# Patient Record
Sex: Male | Born: 1985 | Race: White | Hispanic: No | Marital: Single | Smoking: Current every day smoker
Health system: Southern US, Community
[De-identification: ages and names within clinical notes are randomized; demographics above are authoritative.]

## PROBLEM LIST (undated history)

## (undated) DIAGNOSIS — Z720 Tobacco use: Secondary | ICD-10-CM

---

## 2013-09-10 ENCOUNTER — Emergency Department (HOSPITAL_COMMUNITY)
Admission: EM | Admit: 2013-09-10 | Discharge: 2013-09-10 | Disposition: A | Discharge status: Home / Self Care | Payer: PRIVATE HEALTH INSURANCE | Attending: Emergency Medicine | Admitting: Emergency Medicine

## 2013-09-10 ENCOUNTER — Encounter (HOSPITAL_COMMUNITY): Payer: Self-pay | Admitting: Emergency Medicine

## 2013-09-10 DIAGNOSIS — F17200 Nicotine dependence, unspecified, uncomplicated: Secondary | ICD-10-CM | POA: Insufficient documentation

## 2013-09-10 DIAGNOSIS — K029 Dental caries, unspecified: Secondary | ICD-10-CM | POA: Insufficient documentation

## 2013-09-10 DIAGNOSIS — K0889 Other specified disorders of teeth and supporting structures: Secondary | ICD-10-CM

## 2013-09-10 DIAGNOSIS — K089 Disorder of teeth and supporting structures, unspecified: Secondary | ICD-10-CM | POA: Insufficient documentation

## 2013-09-10 MED ORDER — CLINDAMYCIN PHOSPHATE 600 MG/4ML IJ SOLN: 600.0000 mg | Freq: Once | INTRAMUSCULAR | Status: DC

## 2013-09-10 MED ORDER — ONDANSETRON 8 MG PO TBDP
8.0000 mg | ORAL_TABLET | Freq: Once | ORAL | Status: AC
  Administered 2013-09-10: 8 mg via ORAL
  Filled 2013-09-10: qty 1

## 2013-09-10 MED ORDER — CLINDAMYCIN PHOSPHATE 600 MG/50ML IV SOLN
600.0000 mg | Freq: Once | INTRAVENOUS | Status: AC
  Administered 2013-09-10: 600 mg via INTRAVENOUS
  Filled 2013-09-10: qty 50

## 2013-09-10 NOTE — Discharge Instructions (Signed)
Dental Pain °Toothache is pain in or around a tooth. It may get worse with chewing or with cold or heat.  °HOME CARE °· Your dentist may use a numbing medicine during treatment. If so, you may need to avoid eating until the medicine wears off. Ask your dentist about this. °· Only take medicine as told by your dentist or doctor. °· Avoid chewing food near the painful tooth until after all treatment is done. Ask your dentist about this. °GET HELP RIGHT AWAY IF:  °· The problem gets worse or new problems appear. °· You have a fever. °· There is redness and puffiness (swelling) of the face, jaw, or neck. °· You cannot open your mouth. °· There is pain in the jaw. °· There is very bad pain that is not helped by medicine. °MAKE SURE YOU:  °· Understand these instructions. °· Will watch your condition. °· Will get help right away if you are not doing well or get worse. °Document Released: 11/20/2007 Document Revised: 08/26/2011 Document Reviewed: 11/20/2007 °[employer]® Patient Information ©2014 [employer]. ° °

## 2013-09-10 NOTE — ED Notes (Signed)
Dental pain with swelling of rt jaw.  Sent by dentist here for tx

## 2013-09-12 NOTE — ED Provider Notes (Signed)
CSN: [id number]     Arrival date & time 09/10/13  1220 History   First MD Initiated Contact with Patient 09/10/13 1305     Chief Complaint  Patient presents with  . Dental Pain     (Consider location/radiation/quality/duration/timing/severity/associated sxs/prior Treatment) HPI Comments: Antonio Higgins is a 28 y.o. male who presents to the Emergency Department complaining of facial swelling and dental pain for several days.  He states he was seen at the dentist's office earlier  And advised to come to ED for IV antibiotics.  Patient denies fever, chills, neck pain, difficulty swallowing or breathing.    The history is provided by the patient.    History reviewed. No pertinent past medical history. History reviewed. No pertinent past surgical history. History reviewed. No pertinent family history. History  Substance Use Topics  . Smoking status: Current Every Day Smoker  . Smokeless tobacco: Not on file  . Alcohol Use: No    Review of Systems  Constitutional: Negative for fever and appetite change.  HENT: Positive for dental problem and facial swelling. Negative for congestion, mouth sores, sore throat and trouble swallowing.   Eyes: Negative for pain and visual disturbance.  Musculoskeletal: Negative for neck pain and neck stiffness.  Neurological: Negative for dizziness, facial asymmetry and headaches.  Hematological: Negative for adenopathy.  All other systems reviewed and are negative.      Allergies  Review of patient's allergies indicates no known allergies.  Home Medications  No current outpatient prescriptions on file. BP 109/78  Pulse 86  Temp(Src) 100.1 F (37.8 C) (Oral)  Resp 20  Ht 6\' 1"  (1.854 m)  Wt 240 lb (108.863 kg)  BMI 31.67 kg/m2  SpO2 98% Physical Exam  Nursing note and vitals reviewed. Constitutional: He is oriented to person, place, and time. He appears well-developed and well-nourished. No distress.  HENT:  Head: Normocephalic and  atraumatic.  Right Ear: Tympanic membrane and ear canal normal.  Left Ear: Tympanic membrane and ear canal normal.  Mouth/Throat: Uvula is midline, oropharynx is clear and moist and mucous membranes are normal. No trismus in the jaw. Dental caries present. No dental abscesses or uvula swelling.   Moderate right facial swelling with ttp of the right upper molars, no obvious dental abscess, trismus, or sublingual abnml.    Neck: Normal range of motion. Neck supple.  Cardiovascular: Normal rate, regular rhythm, normal heart sounds and intact distal pulses.   No murmur heard. Pulmonary/Chest: Effort normal and breath sounds normal. No respiratory distress.  Musculoskeletal: Normal range of motion.  Lymphadenopathy:    He has no cervical adenopathy.  Neurological: He is alert and oriented to person, place, and time. He exhibits normal muscle tone. Coordination normal.  Skin: Skin is warm and dry.    ED Course  Procedures (including critical care time) Labs Review Labs Reviewed - No data to display Imaging Review No results found.   EKG Interpretation None      MDM   Final diagnoses:  Pain, dental    Pt is well appearing.  Right facial swelling likely related to dental infection without drainable abscess.  No concerning sx's for infection to floor of the mouth.  No trismus.    IV clindamycin given, pt has Rx for amoxil from the dentist.  Agrees to return to his dentist for follow-up.  Pt appears stable for d/c    Tavarious Freel L. Trisha Mangleriplett, PA-C 09/12/13 2151

## 2013-09-14 NOTE — ED Provider Notes (Signed)
Medical screening examination/treatment/procedure(s) were performed by non-physician practitioner and as supervising physician I was immediately available for consultation/collaboration.   EKG Interpretation None        Laray AngerKathleen M Nika Yazzie, DO 09/14/13 1036

## 2020-11-02 ENCOUNTER — Inpatient Hospital Stay (HOSPITAL_COMMUNITY)
Admission: EM | Admit: 2020-11-02 | Discharge: 2020-11-17 | DRG: 963 | Disposition: A | Discharge status: Rehabilitation Facility | Payer: No Typology Code available for payment source | Attending: Internal Medicine | Admitting: Internal Medicine

## 2020-11-02 ENCOUNTER — Other Ambulatory Visit: Payer: Self-pay

## 2020-11-02 ENCOUNTER — Observation Stay (HOSPITAL_COMMUNITY): Payer: No Typology Code available for payment source

## 2020-11-02 ENCOUNTER — Emergency Department (HOSPITAL_COMMUNITY): Payer: No Typology Code available for payment source

## 2020-11-02 ENCOUNTER — Inpatient Hospital Stay (HOSPITAL_COMMUNITY): Payer: No Typology Code available for payment source | Admitting: Certified Registered"

## 2020-11-02 ENCOUNTER — Inpatient Hospital Stay (HOSPITAL_COMMUNITY): Payer: No Typology Code available for payment source

## 2020-11-02 ENCOUNTER — Encounter (HOSPITAL_COMMUNITY): Payer: Self-pay | Admitting: Internal Medicine

## 2020-11-02 DIAGNOSIS — Z01818 Encounter for other preprocedural examination: Secondary | ICD-10-CM

## 2020-11-02 DIAGNOSIS — R0603 Acute respiratory distress: Secondary | ICD-10-CM

## 2020-11-02 DIAGNOSIS — S0990XA Unspecified injury of head, initial encounter: Principal | ICD-10-CM

## 2020-11-02 DIAGNOSIS — R040 Epistaxis: Secondary | ICD-10-CM | POA: Diagnosis present

## 2020-11-02 DIAGNOSIS — R000 Tachycardia, unspecified: Secondary | ICD-10-CM

## 2020-11-02 DIAGNOSIS — R739 Hyperglycemia, unspecified: Secondary | ICD-10-CM

## 2020-11-02 DIAGNOSIS — D72829 Elevated white blood cell count, unspecified: Secondary | ICD-10-CM | POA: Diagnosis present

## 2020-11-02 DIAGNOSIS — S06303A Unspecified focal traumatic brain injury with loss of consciousness of 1 hour to 5 hours 59 minutes, initial encounter: Secondary | ICD-10-CM | POA: Diagnosis present

## 2020-11-02 DIAGNOSIS — Z4659 Encounter for fitting and adjustment of other gastrointestinal appliance and device: Secondary | ICD-10-CM

## 2020-11-02 DIAGNOSIS — T1490XA Injury, unspecified, initial encounter: Secondary | ICD-10-CM

## 2020-11-02 DIAGNOSIS — Z0189 Encounter for other specified special examinations: Secondary | ICD-10-CM

## 2020-11-02 DIAGNOSIS — F17200 Nicotine dependence, unspecified, uncomplicated: Secondary | ICD-10-CM | POA: Diagnosis present

## 2020-11-02 DIAGNOSIS — E876 Hypokalemia: Secondary | ICD-10-CM

## 2020-11-02 DIAGNOSIS — I609 Nontraumatic subarachnoid hemorrhage, unspecified: Secondary | ICD-10-CM

## 2020-11-02 DIAGNOSIS — Z9911 Dependence on respirator [ventilator] status: Secondary | ICD-10-CM

## 2020-11-02 DIAGNOSIS — Z20822 Contact with and (suspected) exposure to covid-19: Secondary | ICD-10-CM | POA: Diagnosis present

## 2020-11-02 DIAGNOSIS — R451 Restlessness and agitation: Secondary | ICD-10-CM | POA: Diagnosis not present

## 2020-11-02 DIAGNOSIS — Z6838 Body mass index (BMI) 38.0-38.9, adult: Secondary | ICD-10-CM | POA: Diagnosis not present

## 2020-11-02 DIAGNOSIS — E669 Obesity, unspecified: Secondary | ICD-10-CM | POA: Diagnosis present

## 2020-11-02 DIAGNOSIS — Z781 Physical restraint status: Secondary | ICD-10-CM | POA: Diagnosis not present

## 2020-11-02 DIAGNOSIS — S27321A Contusion of lung, unilateral, initial encounter: Secondary | ICD-10-CM | POA: Diagnosis present

## 2020-11-02 DIAGNOSIS — Y92410 Unspecified street and highway as the place of occurrence of the external cause: Secondary | ICD-10-CM

## 2020-11-02 DIAGNOSIS — S40811A Abrasion of right upper arm, initial encounter: Secondary | ICD-10-CM | POA: Diagnosis present

## 2020-11-02 DIAGNOSIS — E872 Acidosis: Secondary | ICD-10-CM | POA: Diagnosis present

## 2020-11-02 DIAGNOSIS — D62 Acute posthemorrhagic anemia: Secondary | ICD-10-CM | POA: Diagnosis not present

## 2020-11-02 DIAGNOSIS — S066X9A Traumatic subarachnoid hemorrhage with loss of consciousness of unspecified duration, initial encounter: Secondary | ICD-10-CM | POA: Diagnosis present

## 2020-11-02 DIAGNOSIS — J9601 Acute respiratory failure with hypoxia: Secondary | ICD-10-CM | POA: Diagnosis not present

## 2020-11-02 DIAGNOSIS — S80811A Abrasion, right lower leg, initial encounter: Secondary | ICD-10-CM | POA: Diagnosis present

## 2020-11-02 DIAGNOSIS — R402412 Glasgow coma scale score 13-15, at arrival to emergency department: Secondary | ICD-10-CM | POA: Diagnosis present

## 2020-11-02 DIAGNOSIS — S065X9A Traumatic subdural hemorrhage with loss of consciousness of unspecified duration, initial encounter: Principal | ICD-10-CM | POA: Diagnosis present

## 2020-11-02 DIAGNOSIS — G4733 Obstructive sleep apnea (adult) (pediatric): Secondary | ICD-10-CM | POA: Diagnosis present

## 2020-11-02 DIAGNOSIS — S0219XA Other fracture of base of skull, initial encounter for closed fracture: Secondary | ICD-10-CM | POA: Diagnosis present

## 2020-11-02 DIAGNOSIS — S061X9A Traumatic cerebral edema with loss of consciousness of unspecified duration, initial encounter: Secondary | ICD-10-CM | POA: Diagnosis present

## 2020-11-02 DIAGNOSIS — S020XXA Fracture of vault of skull, initial encounter for closed fracture: Secondary | ICD-10-CM | POA: Diagnosis present

## 2020-11-02 DIAGNOSIS — J15211 Pneumonia due to Methicillin susceptible Staphylococcus aureus: Secondary | ICD-10-CM | POA: Diagnosis not present

## 2020-11-02 DIAGNOSIS — F17210 Nicotine dependence, cigarettes, uncomplicated: Secondary | ICD-10-CM | POA: Diagnosis present

## 2020-11-02 DIAGNOSIS — R7401 Elevation of levels of liver transaminase levels: Secondary | ICD-10-CM | POA: Diagnosis present

## 2020-11-02 DIAGNOSIS — R001 Bradycardia, unspecified: Secondary | ICD-10-CM | POA: Diagnosis not present

## 2020-11-02 DIAGNOSIS — S80812A Abrasion, left lower leg, initial encounter: Secondary | ICD-10-CM | POA: Diagnosis present

## 2020-11-02 DIAGNOSIS — S40812A Abrasion of left upper arm, initial encounter: Secondary | ICD-10-CM | POA: Diagnosis present

## 2020-11-02 DIAGNOSIS — G9340 Encephalopathy, unspecified: Secondary | ICD-10-CM | POA: Diagnosis present

## 2020-11-02 DIAGNOSIS — S069X9A Unspecified intracranial injury with loss of consciousness of unspecified duration, initial encounter: Secondary | ICD-10-CM

## 2020-11-02 DIAGNOSIS — T07XXXA Unspecified multiple injuries, initial encounter: Secondary | ICD-10-CM

## 2020-11-02 HISTORY — DX: Tobacco use: Z72.0

## 2020-11-02 LAB — LACTIC ACID, PLASMA
Lactic Acid, Venous: 2.7 mmol/L (ref 0.5–1.9)
Lactic Acid, Venous: 4.5 mmol/L (ref 0.5–1.9)

## 2020-11-02 LAB — CBC
HCT: 39.6 % (ref 39.0–52.0)
HCT: 38.7 % — ABNORMAL LOW (ref 39.0–52.0)
Hemoglobin: 13.1 g/dL (ref 13.0–17.0)
Hemoglobin: 13.2 g/dL (ref 13.0–17.0)
MCH: 31 pg (ref 26.0–34.0)
MCH: 31.3 pg (ref 26.0–34.0)
MCHC: 33.1 g/dL (ref 30.0–36.0)
MCHC: 34.1 g/dL (ref 30.0–36.0)
MCV: 93.8 fL (ref 80.0–100.0)
MCV: 91.7 fL (ref 80.0–100.0)
Platelets: 258 10*3/uL (ref 150–400)
Platelets: 244 10*3/uL (ref 150–400)
RBC: 4.22 MIL/uL (ref 4.22–5.81)
RBC: 4.22 MIL/uL (ref 4.22–5.81)
RDW: 12.4 % (ref 11.5–15.5)
RDW: 12.4 % (ref 11.5–15.5)
WBC: 12.9 10*3/uL — ABNORMAL HIGH (ref 4.0–10.5)
WBC: 27.2 10*3/uL — ABNORMAL HIGH (ref 4.0–10.5)
nRBC: 0 % (ref 0.0–0.2)
nRBC: 0 % (ref 0.0–0.2)

## 2020-11-02 LAB — POCT I-STAT 7, (LYTES, BLD GAS, ICA,H+H)
Acid-Base Excess: 0 mmol/L (ref 0.0–2.0)
Acid-base deficit: 1 mmol/L (ref 0.0–2.0)
Bicarbonate: 26.1 mmol/L (ref 20.0–28.0)
Bicarbonate: 23.9 mmol/L (ref 20.0–28.0)
Calcium, Ion: 1.21 mmol/L (ref 1.15–1.40)
Calcium, Ion: 1.21 mmol/L (ref 1.15–1.40)
HCT: 39 % (ref 39.0–52.0)
HCT: 35 % — ABNORMAL LOW (ref 39.0–52.0)
Hemoglobin: 13.3 g/dL (ref 13.0–17.0)
Hemoglobin: 11.9 g/dL — ABNORMAL LOW (ref 13.0–17.0)
O2 Saturation: 93 %
O2 Saturation: 100 %
Patient temperature: 100
Patient temperature: 100.6
Potassium: 3.9 mmol/L (ref 3.5–5.1)
Potassium: 4.3 mmol/L (ref 3.5–5.1)
Sodium: 136 mmol/L (ref 135–145)
Sodium: 136 mmol/L (ref 135–145)
TCO2: 28 mmol/L (ref 22–32)
TCO2: 25 mmol/L (ref 22–32)
pCO2 arterial: 48.6 mmHg — ABNORMAL HIGH (ref 32.0–48.0)
pCO2 arterial: 42.7 mmHg (ref 32.0–48.0)
pH, Arterial: 7.342 — ABNORMAL LOW (ref 7.350–7.450)
pH, Arterial: 7.362 (ref 7.350–7.450)
pO2, Arterial: 74 mmHg — ABNORMAL LOW (ref 83.0–108.0)
pO2, Arterial: 260 mmHg — ABNORMAL HIGH (ref 83.0–108.0)

## 2020-11-02 LAB — I-STAT CHEM 8, ED
BUN: 11 mg/dL (ref 6–20)
Calcium, Ion: 1.02 mmol/L — ABNORMAL LOW (ref 1.15–1.40)
Chloride: 105 mmol/L (ref 98–111)
Creatinine, Ser: 1 mg/dL (ref 0.61–1.24)
Glucose, Bld: 148 mg/dL — ABNORMAL HIGH (ref 70–99)
HCT: 38 % — ABNORMAL LOW (ref 39.0–52.0)
Hemoglobin: 12.9 g/dL — ABNORMAL LOW (ref 13.0–17.0)
Potassium: 3 mmol/L — ABNORMAL LOW (ref 3.5–5.1)
Sodium: 138 mmol/L (ref 135–145)
TCO2: 22 mmol/L (ref 22–32)

## 2020-11-02 LAB — COMPREHENSIVE METABOLIC PANEL
ALT: 141 U/L — ABNORMAL HIGH (ref 0–44)
AST: 76 U/L — ABNORMAL HIGH (ref 15–41)
Albumin: 3.9 g/dL (ref 3.5–5.0)
Alkaline Phosphatase: 63 U/L (ref 38–126)
Anion gap: 13 (ref 5–15)
BUN: 10 mg/dL (ref 6–20)
CO2: 21 mmol/L — ABNORMAL LOW (ref 22–32)
Calcium: 8.9 mg/dL (ref 8.9–10.3)
Chloride: 103 mmol/L (ref 98–111)
Creatinine, Ser: 1.12 mg/dL (ref 0.61–1.24)
GFR, Estimated: >60 mL/min (ref >60)
Glucose, Bld: 151 mg/dL — ABNORMAL HIGH (ref 70–99)
Potassium: 3 mmol/L — ABNORMAL LOW (ref 3.5–5.1)
Sodium: 137 mmol/L (ref 135–145)
Total Bilirubin: 0.5 mg/dL (ref 0.3–1.2)
Total Protein: 6.6 g/dL (ref 6.5–8.1)

## 2020-11-02 LAB — BASIC METABOLIC PANEL
Anion gap: 11 (ref 5–15)
BUN: 8 mg/dL (ref 6–20)
CO2: 24 mmol/L (ref 22–32)
Calcium: 8.9 mg/dL (ref 8.9–10.3)
Chloride: 100 mmol/L (ref 98–111)
Creatinine, Ser: 1.05 mg/dL (ref 0.61–1.24)
GFR, Estimated: >60 mL/min (ref >60)
Glucose, Bld: 150 mg/dL — ABNORMAL HIGH (ref 70–99)
Potassium: 4.2 mmol/L (ref 3.5–5.1)
Sodium: 135 mmol/L (ref 135–145)

## 2020-11-02 LAB — RAPID URINE DRUG SCREEN, HOSP PERFORMED
Amphetamines: NOT DETECTED
Barbiturates: NOT DETECTED
Benzodiazepines: NOT DETECTED
Cocaine: NOT DETECTED
Opiates: NOT DETECTED
Tetrahydrocannabinol: NOT DETECTED

## 2020-11-02 LAB — URINALYSIS, ROUTINE W REFLEX MICROSCOPIC
Bacteria, UA: NONE SEEN
Bilirubin Urine: NEGATIVE
Glucose, UA: >=500 mg/dL — AB
Hgb urine dipstick: NEGATIVE
Ketones, ur: NEGATIVE mg/dL
Leukocytes,Ua: NEGATIVE
Nitrite: NEGATIVE
Protein, ur: NEGATIVE mg/dL
Specific Gravity, Urine: 1.036 — ABNORMAL HIGH (ref 1.005–1.030)
pH: 6 (ref 5.0–8.0)

## 2020-11-02 LAB — MAGNESIUM: Magnesium: 1.8 mg/dL (ref 1.7–2.4)

## 2020-11-02 LAB — SAMPLE TO BLOOD BANK

## 2020-11-02 LAB — RESP PANEL BY RT-PCR (FLU A&B, COVID) ARPGX2
Influenza A by PCR: NEGATIVE
Influenza B by PCR: NEGATIVE
SARS Coronavirus 2 by RT PCR: NEGATIVE

## 2020-11-02 LAB — PROTIME-INR
INR: 1 (ref 0.8–1.2)
Prothrombin Time: 13.3 seconds (ref 11.4–15.2)

## 2020-11-02 LAB — ETHANOL: Alcohol, Ethyl (B): <10 mg/dL (ref <10)

## 2020-11-02 LAB — SODIUM: Sodium: 136 mmol/L (ref 135–145)

## 2020-11-02 LAB — SURGICAL PCR SCREEN
MRSA, PCR: NEGATIVE
Staphylococcus aureus: NEGATIVE

## 2020-11-02 LAB — HIV ANTIBODY (ROUTINE TESTING W REFLEX): HIV Screen 4th Generation wRfx: NONREACTIVE

## 2020-11-02 LAB — APTT: aPTT: 25 seconds (ref 24–36)

## 2020-11-02 MED ORDER — SODIUM CHLORIDE 3 % IV SOLN
INTRAVENOUS | Status: DC
  Filled 2020-11-02 (×4): qty 500

## 2020-11-02 MED ORDER — FENTANYL 2500MCG IN NS 250ML (10MCG/ML) PREMIX INFUSION
0.0000 ug/h | INTRAVENOUS | Status: DC
Start: 2020-11-02 — End: 2020-11-14
  Administered 2020-11-02: 25 ug/h via INTRAVENOUS
  Administered 2020-11-03: 400 ug/h via INTRAVENOUS
  Administered 2020-11-03: 150 ug/h via INTRAVENOUS
  Administered 2020-11-04: 300 ug/h via INTRAVENOUS
  Administered 2020-11-04 – 2020-11-05 (×2): 150 ug/h via INTRAVENOUS
  Administered 2020-11-06 – 2020-11-09 (×5): 125 ug/h via INTRAVENOUS
  Administered 2020-11-10: 100 ug/h via INTRAVENOUS
  Administered 2020-11-11: 125 ug/h via INTRAVENOUS
  Administered 2020-11-12: 400 ug/h via INTRAVENOUS
  Administered 2020-11-12: 300 ug/h via INTRAVENOUS
  Administered 2020-11-12: 150 ug/h via INTRAVENOUS
  Administered 2020-11-13 (×2): 350 ug/h via INTRAVENOUS
  Administered 2020-11-13: 300 ug/h via INTRAVENOUS
  Administered 2020-11-13: 350 ug/h via INTRAVENOUS
  Administered 2020-11-14: 300 ug/h via INTRAVENOUS
  Filled 2020-11-02 (×22): qty 250

## 2020-11-02 MED ORDER — HALOPERIDOL LACTATE 5 MG/ML IJ SOLN
5.0000 mg | Freq: Once | INTRAMUSCULAR | Status: AC
  Administered 2020-11-02: 5 mg via INTRAVENOUS
  Filled 2020-11-02: qty 1

## 2020-11-02 MED ORDER — ACETAMINOPHEN 325 MG PO TABS: 650.0000 mg | ORAL_TABLET | ORAL | Status: DC | PRN

## 2020-11-02 MED ORDER — METOPROLOL TARTRATE 5 MG/5ML IV SOLN
2.5000 mg | Freq: Four times a day (QID) | INTRAVENOUS | Status: DC
  Administered 2020-11-02: 2.5 mg via INTRAVENOUS
  Filled 2020-11-02 (×2): qty 5

## 2020-11-02 MED ORDER — ONDANSETRON 4 MG PO TBDP: 4.0000 mg | ORAL_TABLET | Freq: Four times a day (QID) | ORAL | Status: DC | PRN

## 2020-11-02 MED ORDER — ONDANSETRON HCL 4 MG/2ML IJ SOLN
4.0000 mg | Freq: Once | INTRAMUSCULAR | Status: AC
  Administered 2020-11-02: 4 mg via INTRAVENOUS
  Filled 2020-11-02: qty 2

## 2020-11-02 MED ORDER — MIDAZOLAM HCL 2 MG/2ML IJ SOLN
4.0000 mg | Freq: Once | INTRAMUSCULAR | Status: AC
  Administered 2020-11-02: 4 mg via INTRAVENOUS

## 2020-11-02 MED ORDER — SODIUM CHLORIDE 0.9 % IV SOLN: INTRAVENOUS | Status: DC

## 2020-11-02 MED ORDER — STROKE: EARLY STAGES OF RECOVERY BOOK: Freq: Once | Status: DC

## 2020-11-02 MED ORDER — ACETAMINOPHEN 650 MG RE SUPP
650.0000 mg | Freq: Four times a day (QID) | RECTAL | Status: DC | PRN
  Administered 2020-11-05: 650 mg via RECTAL
  Filled 2020-11-02: qty 1

## 2020-11-02 MED ORDER — MIDAZOLAM HCL 2 MG/2ML IJ SOLN
INTRAMUSCULAR | Status: AC
  Filled 2020-11-02: qty 4

## 2020-11-02 MED ORDER — ONDANSETRON HCL 4 MG/2ML IJ SOLN
4.0000 mg | Freq: Four times a day (QID) | INTRAMUSCULAR | Status: DC | PRN
  Administered 2020-11-15: 4 mg via INTRAVENOUS
  Filled 2020-11-02: qty 2

## 2020-11-02 MED ORDER — POTASSIUM CHLORIDE IN NACL 20-0.9 MEQ/L-% IV SOLN: INTRAVENOUS | Status: DC

## 2020-11-02 MED ORDER — ACETAMINOPHEN 160 MG/5ML PO SOLN: 650.0000 mg | ORAL | Status: DC | PRN

## 2020-11-02 MED ORDER — IOHEXOL 350 MG/ML SOLN
50.0000 mL | Freq: Once | INTRAVENOUS | Status: AC | PRN
  Administered 2020-11-02: 50 mL via INTRAVENOUS

## 2020-11-02 MED ORDER — DOCUSATE SODIUM 100 MG PO CAPS: 100.0000 mg | ORAL_CAPSULE | Freq: Two times a day (BID) | ORAL | Status: DC

## 2020-11-02 MED ORDER — KCL IN DEXTROSE-NACL 20-5-0.9 MEQ/L-%-% IV SOLN
INTRAVENOUS | Status: DC
  Filled 2020-11-02 (×2): qty 1000

## 2020-11-02 MED ORDER — LORAZEPAM 2 MG/ML IJ SOLN
1.0000 mg | INTRAMUSCULAR | Status: DC | PRN
  Administered 2020-11-02: 1 mg via INTRAVENOUS
  Filled 2020-11-02: qty 1

## 2020-11-02 MED ORDER — MUPIROCIN 2 % EX OINT
1.0000 "application " | TOPICAL_OINTMENT | Freq: Two times a day (BID) | CUTANEOUS | Status: AC
  Administered 2020-11-02 – 2020-11-06 (×9): 1 via NASAL
  Filled 2020-11-02: qty 22

## 2020-11-02 MED ORDER — LEVETIRACETAM IN NACL 500 MG/100ML IV SOLN
500.0000 mg | Freq: Two times a day (BID) | INTRAVENOUS | Status: AC
  Administered 2020-11-02 – 2020-11-08 (×14): 500 mg via INTRAVENOUS
  Filled 2020-11-02 (×14): qty 100

## 2020-11-02 MED ORDER — SUCCINYLCHOLINE CHLORIDE 20 MG/ML IJ SOLN
INTRAMUSCULAR | Status: DC | PRN
  Administered 2020-11-02: 140 mg via INTRAVENOUS

## 2020-11-02 MED ORDER — PROPOFOL 10 MG/ML IV BOLUS
INTRAVENOUS | Status: DC | PRN
  Administered 2020-11-02: 200 mg via INTRAVENOUS

## 2020-11-02 MED ORDER — CHLORHEXIDINE GLUCONATE 0.12% ORAL RINSE (MEDLINE KIT)
15.0000 mL | Freq: Two times a day (BID) | OROMUCOSAL | Status: DC
  Administered 2020-11-02 – 2020-11-17 (×30): 15 mL via OROMUCOSAL

## 2020-11-02 MED ORDER — LABETALOL HCL 5 MG/ML IV SOLN: 5.0000 mg | INTRAVENOUS | Status: AC | PRN

## 2020-11-02 MED ORDER — FLUMAZENIL 0.5 MG/5ML IV SOLN
0.2000 mg | Freq: Once | INTRAVENOUS | Status: AC
  Administered 2020-11-02: 0.2 mg via INTRAVENOUS
  Filled 2020-11-02: qty 5

## 2020-11-02 MED ORDER — ACETAMINOPHEN 650 MG RE SUPP: 650.0000 mg | RECTAL | Status: DC | PRN

## 2020-11-02 MED ORDER — LORAZEPAM 2 MG/ML IJ SOLN
1.0000 mg | Freq: Once | INTRAMUSCULAR | Status: AC
  Administered 2020-11-02: 1 mg via INTRAVENOUS
  Filled 2020-11-02: qty 1

## 2020-11-02 MED ORDER — LEVETIRACETAM 500 MG PO TABS: 500.0000 mg | ORAL_TABLET | Freq: Two times a day (BID) | ORAL | Status: DC

## 2020-11-02 MED ORDER — LORAZEPAM 2 MG/ML IJ SOLN
2.0000 mg | INTRAMUSCULAR | Status: DC | PRN
  Administered 2020-11-15: 2 mg via INTRAVENOUS
  Filled 2020-11-02: qty 1

## 2020-11-02 MED ORDER — PANTOPRAZOLE SODIUM 40 MG PO PACK
40.0000 mg | PACK | Freq: Every day | ORAL | Status: DC
  Filled 2020-11-02: qty 20

## 2020-11-02 MED ORDER — DEXMEDETOMIDINE HCL IN NACL 400 MCG/100ML IV SOLN
0.4000 ug/kg/h | INTRAVENOUS | Status: DC
  Administered 2020-11-02: 1 ug/kg/h via INTRAVENOUS
  Administered 2020-11-02 – 2020-11-03 (×3): 1.2 ug/kg/h via INTRAVENOUS
  Administered 2020-11-03: 0.6 ug/kg/h via INTRAVENOUS
  Administered 2020-11-03: 0.4 ug/kg/h via INTRAVENOUS
  Administered 2020-11-03: 0.6 ug/kg/h via INTRAVENOUS
  Administered 2020-11-07: 0.4 ug/kg/h via INTRAVENOUS
  Administered 2020-11-07: 0.5 ug/kg/h via INTRAVENOUS
  Administered 2020-11-08: 0.6 ug/kg/h via INTRAVENOUS
  Administered 2020-11-08: 0.7 ug/kg/h via INTRAVENOUS
  Filled 2020-11-02 (×13): qty 100

## 2020-11-02 MED ORDER — LORAZEPAM 2 MG/ML IJ SOLN
1.0000 mg | INTRAMUSCULAR | Status: DC | PRN
  Administered 2020-11-02: 2 mg via INTRAVENOUS
  Filled 2020-11-02: qty 1

## 2020-11-02 MED ORDER — ORAL CARE MOUTH RINSE
15.0000 mL | OROMUCOSAL | Status: DC
  Administered 2020-11-02 – 2020-11-14 (×115): 15 mL via OROMUCOSAL

## 2020-11-02 MED ORDER — CHLORHEXIDINE GLUCONATE CLOTH 2 % EX PADS
6.0000 | MEDICATED_PAD | Freq: Every day | CUTANEOUS | Status: DC
  Administered 2020-11-02 – 2020-11-05 (×5): 6 via TOPICAL

## 2020-11-02 MED ORDER — LEVETIRACETAM IN NACL 1000 MG/100ML IV SOLN
1000.0000 mg | Freq: Once | INTRAVENOUS | Status: AC
  Administered 2020-11-02: 1000 mg via INTRAVENOUS
  Filled 2020-11-02: qty 100

## 2020-11-02 MED ORDER — PANTOPRAZOLE SODIUM 40 MG PO TBEC: 40.0000 mg | DELAYED_RELEASE_TABLET | Freq: Every day | ORAL | Status: DC

## 2020-11-02 MED ORDER — POTASSIUM CHLORIDE 10 MEQ/100ML IV SOLN
10.0000 meq | INTRAVENOUS | Status: AC
Start: 2020-11-02 — End: 2020-11-02
  Administered 2020-11-02 (×5): 10 meq via INTRAVENOUS
  Filled 2020-11-02 (×5): qty 100

## 2020-11-02 MED ORDER — IOHEXOL 300 MG/ML  SOLN
100.0000 mL | Freq: Once | INTRAMUSCULAR | Status: AC | PRN
  Administered 2020-11-02: 75 mL via INTRAVENOUS

## 2020-11-02 NOTE — Progress Notes (Signed)
Orthopedic Tech Progress Note Patient Details:  Antonio Higgins 08/27/1985 [id number] Level 2 trauma  Patient ID: Antonio Higgins, male   DOB: [birth year], 35 y.o.   MRN: [id number]   Antonio Higgins 11/02/2020, 1:48 AM

## 2020-11-02 NOTE — Progress Notes (Signed)
Patient was transferred from 4NP to ICU. Patient is combative and having difficulty breathing. MD was notified and orders received for intubation. Will continue to monitor.

## 2020-11-02 NOTE — TOC CAGE-AID Note (Signed)
Transition of Care [facility]) - CAGE-AID Screening   Patient Details  Name: Antonio Higgins MRN: [id number] Date of Birth: [birth year]   Clinical Narrative:  Patient disoriented X4, s/p [facility] 5/18 with [facility]. Patient unable to participate at this time.  CAGE-AID Screening: Substance Abuse Screening unable to be completed due to: : Patient unable to participate (Patient disoriented x4, [facility] with [facility] 5/18)
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
## 2020-11-02 NOTE — Anesthesia Procedure Notes (Signed)
Procedure Name: Intubation Date/Time: 11/02/2020 5:49 PM Performed by: Sheppard Evens, CRNA Pre-anesthesia Checklist: Patient identified, Emergency Drugs available, Suction available and Patient being monitored Patient Re-evaluated:Patient Re-evaluated prior to induction Oxygen Delivery Method: Ambu bag Preoxygenation: Pre-oxygenation with 100% oxygen Induction Type: IV induction and Rapid sequence Ventilation: Mask ventilation without difficulty Laryngoscope Size: Glidescope and 4 Grade View: Grade I Tube type: Subglottic suction tube Tube size: 7.5 mm Number of attempts: 1 Airway Equipment and Method: Stylet and Oral airway Placement Confirmation: ETT inserted through vocal cords under direct vision,  positive ETCO2 and breath sounds checked- equal and bilateral Secured at: 23 cm Tube secured with: Tape Dental Injury: Teeth and Oropharynx as per pre-operative assessment
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
## 2020-11-02 NOTE — Progress Notes (Signed)
Reviewed repeat CT head.  There is some more conspicuous scattered traumatic SAH.  His small SDH is stable to slightly decreased.  However there is also more sulcal effacement than prior.  Given his encephalopathy, I recommend observation in ICU and another CT head tomorrow morning, continuing to hold DVT prophylaxis.  Additionally, given his evidence of diffuse brain injury, would recommend maintaining Na+ 140-145 with 3% as necessary.
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
## 2020-11-03 ENCOUNTER — Inpatient Hospital Stay: Payer: Self-pay

## 2020-11-03 ENCOUNTER — Inpatient Hospital Stay (HOSPITAL_COMMUNITY): Payer: No Typology Code available for payment source

## 2020-11-03 DIAGNOSIS — I609 Nontraumatic subarachnoid hemorrhage, unspecified: Secondary | ICD-10-CM

## 2020-11-03 LAB — HEPATIC FUNCTION PANEL
ALT: 103 U/L — ABNORMAL HIGH (ref 0–44)
AST: 45 U/L — ABNORMAL HIGH (ref 15–41)
Albumin: 3.2 g/dL — ABNORMAL LOW (ref 3.5–5.0)
Alkaline Phosphatase: 51 U/L (ref 38–126)
Bilirubin, Direct: 0.3 mg/dL — ABNORMAL HIGH (ref 0.0–0.2)
Indirect Bilirubin: 0.7 mg/dL (ref 0.3–0.9)
Total Bilirubin: 1 mg/dL (ref 0.3–1.2)
Total Protein: 6.1 g/dL — ABNORMAL LOW (ref 6.5–8.1)

## 2020-11-03 LAB — PHOSPHORUS: Phosphorus: 2.1 mg/dL — ABNORMAL LOW (ref 2.5–4.6)

## 2020-11-03 LAB — MAGNESIUM: Magnesium: 2.4 mg/dL (ref 1.7–2.4)

## 2020-11-03 LAB — GLUCOSE, CAPILLARY
Glucose-Capillary: 126 mg/dL — ABNORMAL HIGH (ref 70–99)
Glucose-Capillary: 154 mg/dL — ABNORMAL HIGH (ref 70–99)

## 2020-11-03 LAB — BASIC METABOLIC PANEL
Anion gap: 6 (ref 5–15)
BUN: 17 mg/dL (ref 6–20)
CO2: 23 mmol/L (ref 22–32)
Calcium: 8.5 mg/dL — ABNORMAL LOW (ref 8.9–10.3)
Chloride: 108 mmol/L (ref 98–111)
Creatinine, Ser: 1.23 mg/dL (ref 0.61–1.24)
GFR, Estimated: >60 mL/min (ref >60)
Glucose, Bld: 148 mg/dL — ABNORMAL HIGH (ref 70–99)
Potassium: 4.3 mmol/L (ref 3.5–5.1)
Sodium: 137 mmol/L (ref 135–145)

## 2020-11-03 LAB — PROTIME-INR
INR: 1.1 (ref 0.8–1.2)
Prothrombin Time: 14.1 seconds (ref 11.4–15.2)

## 2020-11-03 LAB — SODIUM
Sodium: 140 mmol/L (ref 135–145)
Sodium: 142 mmol/L (ref 135–145)

## 2020-11-03 LAB — CK: Total CK: 344 U/L (ref 49–397)

## 2020-11-03 MED ORDER — SODIUM CHLORIDE 3 % IV SOLN
INTRAVENOUS | Status: DC
  Administered 2020-11-03: 75 mL/h via INTRAVENOUS
  Filled 2020-11-03: qty 500

## 2020-11-03 MED ORDER — ALBUMIN HUMAN 5 % IV SOLN
12.5000 g | Freq: Once | INTRAVENOUS | Status: AC
  Administered 2020-11-03: 12.5 g via INTRAVENOUS
  Filled 2020-11-03: qty 250

## 2020-11-03 MED ORDER — ROCURONIUM BROMIDE 10 MG/ML (PF) SYRINGE
100.0000 mg | PREFILLED_SYRINGE | Freq: Once | INTRAVENOUS | Status: AC
  Administered 2020-11-03: 100 mg via INTRAVENOUS
  Filled 2020-11-03 (×2): qty 10

## 2020-11-03 MED ORDER — SODIUM CHLORIDE 0.9% FLUSH
10.0000 mL | Freq: Two times a day (BID) | INTRAVENOUS | Status: DC
  Administered 2020-11-03 – 2020-11-05 (×6): 10 mL
  Administered 2020-11-06: 20 mL
  Administered 2020-11-06 – 2020-11-07 (×2): 10 mL
  Administered 2020-11-07: 20 mL
  Administered 2020-11-08 – 2020-11-12 (×10): 10 mL
  Administered 2020-11-13 (×2): 20 mL
  Administered 2020-11-14 (×2): 10 mL
  Administered 2020-11-15: 40 mL
  Administered 2020-11-15: 10 mL

## 2020-11-03 MED ORDER — ROCURONIUM BROMIDE 10 MG/ML (PF) SYRINGE
PREFILLED_SYRINGE | INTRAVENOUS | Status: AC
  Filled 2020-11-03: qty 10

## 2020-11-03 MED ORDER — SODIUM CHLORIDE 0.9% FLUSH: 10.0000 mL | INTRAVENOUS | Status: DC | PRN

## 2020-11-03 MED ORDER — CLONAZEPAM 0.5 MG PO TABS
0.5000 mg | ORAL_TABLET | Freq: Two times a day (BID) | ORAL | Status: DC
  Administered 2020-11-03 – 2020-11-14 (×23): 0.5 mg
  Filled 2020-11-03 (×23): qty 1

## 2020-11-03 MED ORDER — PIVOT 1.5 CAL PO LIQD
1000.0000 mL | ORAL | Status: DC
  Administered 2020-11-03 – 2020-11-14 (×12): 1000 mL
  Filled 2020-11-03 (×4): qty 1000

## 2020-11-03 MED ORDER — PIVOT 1.5 CAL PO LIQD: 1000.0000 mL | ORAL | Status: DC

## 2020-11-03 MED ORDER — SODIUM CHLORIDE 0.9 % IV SOLN
INTRAVENOUS | Status: DC | PRN
  Administered 2020-11-03 – 2020-11-06 (×2): 500 mL via INTRAVENOUS

## 2020-11-03 MED ORDER — LIDOCAINE HCL URETHRAL/MUCOSAL 2 % EX GEL
1.0000 "application " | Freq: Once | CUTANEOUS | Status: DC
  Filled 2020-11-03: qty 11

## 2020-11-03 MED ORDER — QUETIAPINE FUMARATE 25 MG PO TABS
50.0000 mg | ORAL_TABLET | Freq: Two times a day (BID) | ORAL | Status: DC
  Administered 2020-11-03 (×2): 50 mg
  Filled 2020-11-03 (×2): qty 2
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
## 2020-11-03 NOTE — Plan of Care (Signed)
Precedex gtt for anxiety.

## 2020-11-03 NOTE — Progress Notes (Signed)
Transcranial Doppler  Date POD PCO2 HCT BP  MCA ACA PCA OPHT SIPH VERT Basilar  5/20 MR     Right  Left   60  71   -32  -29   *  16   31  24    36  82   *  *   *           Right  Left                                            Right  Left                                             Right  Left                                             Right  Left                                            Right  Left                                            Right  Left                                        MCA = Middle Cerebral Artery      OPHT = Opthalmic Artery     BASILAR = Basilar Artery   ACA = Anterior Cerebral Artery     SIPH = Carotid Siphon PCA = Posterior Cerebral Artery   VERT = Verterbral Artery                   Normal MCA = 62+\-12 ACA = 50+\-12 PCA = 42+\-23   *- unable to insonate    11/03/2020 11:33 AM

## 2020-11-03 NOTE — Progress Notes (Signed)
RT NOTE: RT transported patient on ventilator from room 4N30 to CT and back to room 4N30 with no complications. Vitals are stable. RT will continue to monitor.
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
## 2020-11-03 NOTE — Progress Notes (Signed)
Peripherally Inserted Central Catheter Placement  The IV Nurse has discussed with the patient and/or persons authorized to consent for the patient, the purpose of this procedure and the potential benefits and risks involved with this procedure.  The benefits include less needle sticks, lab draws from the catheter, and the patient may be discharged home with the catheter. Risks include, but not limited to, infection, bleeding, blood clot (thrombus formation), and puncture of an artery; nerve damage and irregular heartbeat and possibility to perform a PICC exchange if needed/ordered by physician.  Alternatives to this procedure were also discussed.  B[employer] Power PICC patient education guide, fact sheet on infection prevention and patient information card has been provided to patient /or left at bedside.    PICC Placement Documentation  PICC Triple Lumen 11/03/20 PICC Right Basilic 46 cm (Active)  Indication for Insertion or Continuance of Line Administration of hyperosmolar/irritating solutions (i.e. TPN, Vancomycin, etc.) 11/03/20 1050  Exposed Catheter (cm) 0 cm 11/03/20 1050  Site Assessment Clean;Dry;Intact 11/03/20 1050  Lumen #1 Status Flushed;Blood return noted;Saline locked 11/03/20 1050  Lumen #2 Status Flushed;Blood return noted;Saline locked 11/03/20 1050  Lumen #3 Status Flushed;Blood return noted;Saline locked 11/03/20 1050  Dressing Type Transparent 11/03/20 1050  Dressing Status Clean;Intact;Dry 11/03/20 1050  Antimicrobial disc in place? Yes 11/03/20 1050  Dressing Change Due 11/10/20 11/03/20 1050       Alzada Brazee Ramos 11/03/2020, 10:59 AM
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
## 2020-11-04 ENCOUNTER — Inpatient Hospital Stay (HOSPITAL_COMMUNITY): Payer: No Typology Code available for payment source

## 2020-11-04 LAB — GLUCOSE, CAPILLARY
Glucose-Capillary: 160 mg/dL — ABNORMAL HIGH (ref 70–99)
Glucose-Capillary: 123 mg/dL — ABNORMAL HIGH (ref 70–99)
Glucose-Capillary: 130 mg/dL — ABNORMAL HIGH (ref 70–99)
Glucose-Capillary: 141 mg/dL — ABNORMAL HIGH (ref 70–99)
Glucose-Capillary: 128 mg/dL — ABNORMAL HIGH (ref 70–99)
Glucose-Capillary: 135 mg/dL — ABNORMAL HIGH (ref 70–99)

## 2020-11-04 LAB — PHOSPHORUS
Phosphorus: 1.8 mg/dL — ABNORMAL LOW (ref 2.5–4.6)
Phosphorus: 3 mg/dL (ref 2.5–4.6)

## 2020-11-04 LAB — BASIC METABOLIC PANEL
Anion gap: 3 — ABNORMAL LOW (ref 5–15)
BUN: 20 mg/dL (ref 6–20)
CO2: 26 mmol/L (ref 22–32)
Calcium: 8.5 mg/dL — ABNORMAL LOW (ref 8.9–10.3)
Chloride: 121 mmol/L — ABNORMAL HIGH (ref 98–111)
Creatinine, Ser: 0.95 mg/dL (ref 0.61–1.24)
GFR, Estimated: >60 mL/min (ref >60)
Glucose, Bld: 149 mg/dL — ABNORMAL HIGH (ref 70–99)
Potassium: 3.8 mmol/L (ref 3.5–5.1)
Sodium: 150 mmol/L — ABNORMAL HIGH (ref 135–145)

## 2020-11-04 LAB — CBC
HCT: 27.2 % — ABNORMAL LOW (ref 39.0–52.0)
Hemoglobin: 8.7 g/dL — ABNORMAL LOW (ref 13.0–17.0)
MCH: 31.3 pg (ref 26.0–34.0)
MCHC: 32 g/dL (ref 30.0–36.0)
MCV: 97.8 fL (ref 80.0–100.0)
Platelets: 151 10*3/uL (ref 150–400)
RBC: 2.78 MIL/uL — ABNORMAL LOW (ref 4.22–5.81)
RDW: 12.8 % (ref 11.5–15.5)
WBC: 12.3 10*3/uL — ABNORMAL HIGH (ref 4.0–10.5)
nRBC: 0 % (ref 0.0–0.2)

## 2020-11-04 LAB — MAGNESIUM
Magnesium: 2.6 mg/dL — ABNORMAL HIGH (ref 1.7–2.4)
Magnesium: 2.5 mg/dL — ABNORMAL HIGH (ref 1.7–2.4)

## 2020-11-04 LAB — SODIUM
Sodium: 147 mmol/L — ABNORMAL HIGH (ref 135–145)
Sodium: 152 mmol/L — ABNORMAL HIGH (ref 135–145)
Sodium: 150 mmol/L — ABNORMAL HIGH (ref 135–145)

## 2020-11-04 MED ORDER — QUETIAPINE FUMARATE 100 MG PO TABS
100.0000 mg | ORAL_TABLET | Freq: Two times a day (BID) | ORAL | Status: DC
  Administered 2020-11-04 – 2020-11-06 (×5): 100 mg
  Filled 2020-11-04 (×5): qty 1

## 2020-11-04 MED ORDER — PROPOFOL 1000 MG/100ML IV EMUL
INTRAVENOUS | Status: AC
  Administered 2020-11-04: 40 ug/kg/min via INTRAVENOUS
  Filled 2020-11-04: qty 100

## 2020-11-04 MED ORDER — POTASSIUM PHOSPHATES 15 MMOLE/5ML IV SOLN
30.0000 mmol | Freq: Once | INTRAVENOUS | Status: AC
  Administered 2020-11-04: 30 mmol via INTRAVENOUS
  Filled 2020-11-04: qty 10

## 2020-11-04 MED ORDER — ROCURONIUM BROMIDE 10 MG/ML (PF) SYRINGE
100.0000 mg | PREFILLED_SYRINGE | Freq: Once | INTRAVENOUS | Status: AC | PRN
  Administered 2020-11-04: 100 mg via INTRAVENOUS
  Filled 2020-11-04 (×2): qty 10

## 2020-11-04 MED ORDER — ENOXAPARIN SODIUM 30 MG/0.3ML IJ SOSY
30.0000 mg | PREFILLED_SYRINGE | Freq: Two times a day (BID) | INTRAMUSCULAR | Status: DC
  Administered 2020-11-04 – 2020-11-17 (×25): 30 mg via SUBCUTANEOUS
  Filled 2020-11-04 (×24): qty 0.3

## 2020-11-04 MED ORDER — SODIUM CHLORIDE 3 % IV SOLN
INTRAVENOUS | Status: DC
  Filled 2020-11-04 (×3): qty 500

## 2020-11-04 MED ORDER — PROPOFOL 1000 MG/100ML IV EMUL
5.0000 ug/kg/min | INTRAVENOUS | Status: DC
Start: 2020-11-04 — End: 2020-11-10
  Administered 2020-11-04 (×2): 30 ug/kg/min via INTRAVENOUS
  Administered 2020-11-04: 20 ug/kg/min via INTRAVENOUS
  Administered 2020-11-05 (×6): 30 ug/kg/min via INTRAVENOUS
  Administered 2020-11-06: 10 ug/kg/min via INTRAVENOUS
  Administered 2020-11-06: 25 ug/kg/min via INTRAVENOUS
  Administered 2020-11-06: 20 ug/kg/min via INTRAVENOUS
  Administered 2020-11-06: 30 ug/kg/min via INTRAVENOUS
  Administered 2020-11-07: 25 ug/kg/min via INTRAVENOUS
  Administered 2020-11-08: 50 ug/kg/min via INTRAVENOUS
  Administered 2020-11-08: 49.949 ug/kg/min via INTRAVENOUS
  Administered 2020-11-08: 30 ug/kg/min via INTRAVENOUS
  Administered 2020-11-09 (×3): 50 ug/kg/min via INTRAVENOUS
  Administered 2020-11-09: 49.949 ug/kg/min via INTRAVENOUS
  Administered 2020-11-09 – 2020-11-10 (×5): 50 ug/kg/min via INTRAVENOUS
  Filled 2020-11-04 (×19): qty 100
  Filled 2020-11-04: qty 200
  Filled 2020-11-04 (×12): qty 100
  Filled 2020-11-04: qty 200
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
## 2020-11-04 NOTE — Progress Notes (Signed)
RT unable to get ABG due to pt. Agitation. RN states she is unable to give pt more sedation due to pt pressures.

## 2020-11-04 NOTE — Progress Notes (Signed)
PT transported to Ct and back to 4N30 without any complications.

## 2020-11-04 NOTE — Progress Notes (Signed)
Sodium 152 at 1600 check. Dr. Maisie Fus aware. Verbal order to stop 3% and restart at 84ml/hr if sodium has dropped to <150 at next check.
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
## 2020-11-05 LAB — GLUCOSE, CAPILLARY
Glucose-Capillary: 130 mg/dL — ABNORMAL HIGH (ref 70–99)
Glucose-Capillary: 135 mg/dL — ABNORMAL HIGH (ref 70–99)
Glucose-Capillary: 138 mg/dL — ABNORMAL HIGH (ref 70–99)
Glucose-Capillary: 139 mg/dL — ABNORMAL HIGH (ref 70–99)
Glucose-Capillary: 151 mg/dL — ABNORMAL HIGH (ref 70–99)
Glucose-Capillary: 155 mg/dL — ABNORMAL HIGH (ref 70–99)

## 2020-11-05 LAB — BASIC METABOLIC PANEL
Anion gap: 4 — ABNORMAL LOW (ref 5–15)
BUN: 20 mg/dL (ref 6–20)
CO2: 29 mmol/L (ref 22–32)
Calcium: 8.5 mg/dL — ABNORMAL LOW (ref 8.9–10.3)
Chloride: 118 mmol/L — ABNORMAL HIGH (ref 98–111)
Creatinine, Ser: 1 mg/dL (ref 0.61–1.24)
GFR, Estimated: >60 mL/min (ref >60)
Glucose, Bld: 143 mg/dL — ABNORMAL HIGH (ref 70–99)
Potassium: 3.7 mmol/L (ref 3.5–5.1)
Sodium: 151 mmol/L — ABNORMAL HIGH (ref 135–145)

## 2020-11-05 LAB — CBC
HCT: 29.6 % — ABNORMAL LOW (ref 39.0–52.0)
Hemoglobin: 9.3 g/dL — ABNORMAL LOW (ref 13.0–17.0)
MCH: 31 pg (ref 26.0–34.0)
MCHC: 31.4 g/dL (ref 30.0–36.0)
MCV: 98.7 fL (ref 80.0–100.0)
Platelets: 174 10*3/uL (ref 150–400)
RBC: 3 MIL/uL — ABNORMAL LOW (ref 4.22–5.81)
RDW: 13.5 % (ref 11.5–15.5)
WBC: 13 10*3/uL — ABNORMAL HIGH (ref 4.0–10.5)
nRBC: 0 % (ref 0.0–0.2)

## 2020-11-05 LAB — PHOSPHORUS: Phosphorus: 2.8 mg/dL (ref 2.5–4.6)

## 2020-11-05 LAB — SODIUM
Sodium: 146 mmol/L — ABNORMAL HIGH (ref 135–145)
Sodium: 147 mmol/L — ABNORMAL HIGH (ref 135–145)
Sodium: 149 mmol/L — ABNORMAL HIGH (ref 135–145)

## 2020-11-05 LAB — TRIGLYCERIDES: Triglycerides: 141 mg/dL (ref <150)

## 2020-11-05 LAB — MAGNESIUM: Magnesium: 2.6 mg/dL — ABNORMAL HIGH (ref 1.7–2.4)

## 2020-11-05 MED ORDER — ACETAMINOPHEN 650 MG RE SUPP: 650.0000 mg | RECTAL | Status: DC | PRN

## 2020-11-05 MED ORDER — ACETAMINOPHEN 160 MG/5ML PO SOLN: 650.0000 mg | ORAL | Status: DC | PRN

## 2020-11-05 MED ORDER — ACETAMINOPHEN 325 MG PO TABS: 650.0000 mg | ORAL_TABLET | ORAL | Status: DC | PRN

## 2020-11-05 NOTE — Progress Notes (Signed)
Pt intubated, sedated for ICPs, pupils pinpoint (on high dose fentanyl gtt), mildly dysconjugate, +c/c/g, in 4 point restraints, no w/d x4 but sedation not held.  -cont 3% @ 25 -Dr. Maisie Fus to determine total duration of 3% tomorrow
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
## 2020-11-05 NOTE — Progress Notes (Signed)
IVF of 3% was started at 1100 today. 1600 Na lab results showed level at 147. I confirmed with Dr. Maurice Small to keep 3% @ 79ml/hr. Orders placed and pharmacy made aware. I also notified Dr. Maurice Small of patients temp and v/o given to continue to monitor.

## 2020-11-06 ENCOUNTER — Inpatient Hospital Stay (HOSPITAL_COMMUNITY): Payer: No Typology Code available for payment source

## 2020-11-06 DIAGNOSIS — I609 Nontraumatic subarachnoid hemorrhage, unspecified: Secondary | ICD-10-CM

## 2020-11-06 LAB — CBC
HCT: 29.6 % — ABNORMAL LOW (ref 39.0–52.0)
Hemoglobin: 9.4 g/dL — ABNORMAL LOW (ref 13.0–17.0)
MCH: 31.4 pg (ref 26.0–34.0)
MCHC: 31.8 g/dL (ref 30.0–36.0)
MCV: 99 fL (ref 80.0–100.0)
Platelets: 188 10*3/uL (ref 150–400)
RBC: 2.99 MIL/uL — ABNORMAL LOW (ref 4.22–5.81)
RDW: 13.5 % (ref 11.5–15.5)
WBC: 11.4 10*3/uL — ABNORMAL HIGH (ref 4.0–10.5)
nRBC: 0 % (ref 0.0–0.2)

## 2020-11-06 LAB — BASIC METABOLIC PANEL
Anion gap: 3 — ABNORMAL LOW (ref 5–15)
BUN: 18 mg/dL (ref 6–20)
CO2: 28 mmol/L (ref 22–32)
Calcium: 8.2 mg/dL — ABNORMAL LOW (ref 8.9–10.3)
Chloride: 118 mmol/L — ABNORMAL HIGH (ref 98–111)
Creatinine, Ser: 0.9 mg/dL (ref 0.61–1.24)
GFR, Estimated: >60 mL/min (ref >60)
Glucose, Bld: 150 mg/dL — ABNORMAL HIGH (ref 70–99)
Potassium: 3.4 mmol/L — ABNORMAL LOW (ref 3.5–5.1)
Sodium: 149 mmol/L — ABNORMAL HIGH (ref 135–145)

## 2020-11-06 LAB — GLUCOSE, CAPILLARY
Glucose-Capillary: 128 mg/dL — ABNORMAL HIGH (ref 70–99)
Glucose-Capillary: 132 mg/dL — ABNORMAL HIGH (ref 70–99)
Glucose-Capillary: 139 mg/dL — ABNORMAL HIGH (ref 70–99)
Glucose-Capillary: 147 mg/dL — ABNORMAL HIGH (ref 70–99)
Glucose-Capillary: 148 mg/dL — ABNORMAL HIGH (ref 70–99)
Glucose-Capillary: 148 mg/dL — ABNORMAL HIGH (ref 70–99)

## 2020-11-06 LAB — SODIUM
Sodium: 150 mmol/L — ABNORMAL HIGH (ref 135–145)
Sodium: 149 mmol/L — ABNORMAL HIGH (ref 135–145)
Sodium: 150 mmol/L — ABNORMAL HIGH (ref 135–145)

## 2020-11-06 MED ORDER — ACETAMINOPHEN 500 MG PO TABS
1000.0000 mg | ORAL_TABLET | Freq: Four times a day (QID) | ORAL | Status: DC
  Administered 2020-11-06 – 2020-11-14 (×32): 1000 mg
  Filled 2020-11-06 (×32): qty 2

## 2020-11-06 MED ORDER — SODIUM CHLORIDE 3 % IV SOLN: INTRAVENOUS | Status: DC

## 2020-11-06 MED ORDER — MAGNESIUM CITRATE PO SOLN: 1.0000 | Freq: Once | ORAL | Status: DC

## 2020-11-06 MED ORDER — ACETAMINOPHEN 500 MG PO TABS: 1000.0000 mg | ORAL_TABLET | Freq: Four times a day (QID) | ORAL | Status: DC

## 2020-11-06 MED ORDER — DOCUSATE SODIUM 50 MG/5ML PO LIQD
100.0000 mg | Freq: Two times a day (BID) | ORAL | Status: DC
  Administered 2020-11-06 – 2020-11-13 (×15): 100 mg
  Filled 2020-11-06 (×15): qty 10

## 2020-11-06 MED ORDER — OXYCODONE HCL 5 MG/5ML PO SOLN
5.0000 mg | ORAL | Status: DC | PRN
  Administered 2020-11-07 – 2020-11-10 (×4): 10 mg
  Filled 2020-11-06 (×4): qty 10

## 2020-11-06 MED ORDER — POLYETHYLENE GLYCOL 3350 17 G PO PACK
17.0000 g | PACK | Freq: Every day | ORAL | Status: DC
  Administered 2020-11-06 – 2020-11-13 (×6): 17 g
  Filled 2020-11-06 (×7): qty 1

## 2020-11-06 MED ORDER — METHOCARBAMOL 500 MG PO TABS
1000.0000 mg | ORAL_TABLET | Freq: Three times a day (TID) | ORAL | Status: DC
  Administered 2020-11-06 – 2020-11-14 (×24): 1000 mg
  Filled 2020-11-06 (×25): qty 2

## 2020-11-06 MED ORDER — CHLORHEXIDINE GLUCONATE CLOTH 2 % EX PADS
6.0000 | MEDICATED_PAD | Freq: Every day | CUTANEOUS | Status: DC
  Administered 2020-11-06 – 2020-11-16 (×12): 6 via TOPICAL

## 2020-11-06 MED ORDER — FENTANYL BOLUS VIA INFUSION
25.0000 ug | INTRAVENOUS | Status: DC | PRN
  Administered 2020-11-06 (×2): 100 ug via INTRAVENOUS
  Administered 2020-11-06 – 2020-11-11 (×5): 50 ug via INTRAVENOUS
  Administered 2020-11-12: 100 ug via INTRAVENOUS
  Administered 2020-11-12 (×2): 50 ug via INTRAVENOUS
  Administered 2020-11-13 – 2020-11-14 (×5): 100 ug via INTRAVENOUS
  Filled 2020-11-06: qty 100

## 2020-11-06 MED ORDER — BETHANECHOL CHLORIDE 10 MG PO TABS
50.0000 mg | ORAL_TABLET | Freq: Three times a day (TID) | ORAL | Status: DC
  Administered 2020-11-06 – 2020-11-09 (×11): 50 mg
  Filled 2020-11-06 (×11): qty 5

## 2020-11-06 MED ORDER — QUETIAPINE FUMARATE 200 MG PO TABS
200.0000 mg | ORAL_TABLET | Freq: Two times a day (BID) | ORAL | Status: DC
  Administered 2020-11-06 – 2020-11-14 (×16): 200 mg
  Filled 2020-11-06 (×16): qty 1

## 2020-11-06 MED ORDER — MAGNESIUM CITRATE PO SOLN
1.0000 | Freq: Once | ORAL | Status: AC
  Administered 2020-11-06: 1
  Filled 2020-11-06: qty 296

## 2020-11-06 MED ORDER — BISACODYL 10 MG RE SUPP
10.0000 mg | Freq: Every day | RECTAL | Status: DC | PRN
  Administered 2020-11-06 – 2020-11-12 (×2): 10 mg via RECTAL
  Filled 2020-11-06 (×2): qty 1

## 2020-11-06 NOTE — Procedures (Signed)
Cortrak  Person Inserting Tube:  Abubakar Crispo, RD Tube Type:  Cortrak - 43 inches Tube Location:  Right nare Initial Placement:  Stomach Secured by: Bridle Technique Used to Measure Tube Placement:  Documented cm marking at nare/ corner of mouth Cortrak Secured At:  69 cm   Cortrak Tube Team Note:  Consult received to place a Cortrak feeding tube.   X-ray is required, abdominal x-ray has been ordered by the Cortrak team. Please confirm tube placement before using the Cortrak tube.   If the tube becomes dislodged please keep the tube and contact the Cortrak team at [email or web address] ([email or web address]) for replacement.  If after hours and replacement cannot be delayed, place a NG tube and confirm placement with an abdominal x-ray.   Vanessa Kick RD, LDN Clinical Nutrition Pager listed in AMION

## 2020-11-06 NOTE — Progress Notes (Signed)
Trauma/Critical Care Follow Up Note  Subjective:    Overnight Issues:   Objective:  Vital signs for last 24 hours: Temp:  [98.2 F (36.8 C)-100.1 F (37.8 C)] 98.7 F (37.1 C) (05/23 1200) Pulse Rate:  [43-77] 69 (05/23 1300) Resp:  [13-27] 14 (05/23 1300) BP: (117-166)/(65-89) 166/80 (05/23 1300) SpO2:  [88 %-98 %] 92 % (05/23 1300) FiO2 (%):  [40 %] 40 % (05/23 1115) Weight:  [132.7 kg] 132.7 kg (05/23 0500)  Hemodynamic parameters for last 24 hours:    Intake/Output from previous day: 05/22 0701 - 05/23 0700 In: 3449.3 [I.V.:1654.3; GL/OV:5643; IV Piggyback:200] Out: 1075 [Urine:1075]  Intake/Output this shift: Total I/O In: 716.7 [I.V.:326.7; NG/GT:390] Out: 625 [Urine:625]  Vent settings for last 24 hours: Vent Mode: PSV;CPAP FiO2 (%):  [40 %] 40 % Set Rate:  [18 bmp] 18 bmp Vt Set:  [650 mL] 650 mL PEEP:  [5 cmH20] 5 cmH20 Pressure Support:  [5 cmH20] 5 cmH20 Plateau Pressure:  [16 cmH20-21 cmH20] 16 cmH20  Physical Exam:  Gen: comfortable, no distress Neuro: non-focal exam, not f/c for me, but per Dr. Maisie Fus f/c HEENT: PERRL Neck: supple CV: RRR Pulm: unlabored breathing Abd: soft, NT GU: clear yellow urine Extr: wwp, no edema   Results for orders placed or performed during the hospital encounter of 11/02/20 (from the past 24 hour(s))  Sodium     Status: Abnormal   Collection Time: 11/05/20  3:30 PM  Result Value Ref Range   Sodium 147 (H) 135 - 145 mmol/L  Glucose, capillary     Status: Abnormal   Collection Time: 11/05/20  4:07 PM  Result Value Ref Range   Glucose-Capillary 138 (H) 70 - 99 mg/dL  Glucose, capillary     Status: Abnormal   Collection Time: 11/05/20  8:01 PM  Result Value Ref Range   Glucose-Capillary 130 (H) 70 - 99 mg/dL  Sodium     Status: Abnormal   Collection Time: 11/05/20  9:58 PM  Result Value Ref Range   Sodium 149 (H) 135 - 145 mmol/L  Glucose, capillary     Status: Abnormal   Collection Time: 11/05/20 11:40 PM   Result Value Ref Range   Glucose-Capillary 135 (H) 70 - 99 mg/dL  Glucose, capillary     Status: Abnormal   Collection Time: 11/06/20  3:53 AM  Result Value Ref Range   Glucose-Capillary 139 (H) 70 - 99 mg/dL  CBC     Status: Abnormal   Collection Time: 11/06/20  4:10 AM  Result Value Ref Range   WBC 11.4 (H) 4.0 - 10.5 K/uL   RBC 2.99 (L) 4.22 - 5.81 MIL/uL   Hemoglobin 9.4 (L) 13.0 - 17.0 g/dL   HCT 32.9 (L) 51.8 - 84.1 %   MCV 99.0 80.0 - 100.0 fL   MCH 31.4 26.0 - 34.0 pg   MCHC 31.8 30.0 - 36.0 g/dL   RDW 66.0 63.0 - 16.0 %   Platelets 188 150 - 400 K/uL   nRBC 0.0 0.0 - 0.2 %  Basic metabolic panel     Status: Abnormal   Collection Time: 11/06/20  4:10 AM  Result Value Ref Range   Sodium 149 (H) 135 - 145 mmol/L   Potassium 3.4 (L) 3.5 - 5.1 mmol/L   Chloride 118 (H) 98 - 111 mmol/L   CO2 28 22 - 32 mmol/L   Glucose, Bld 150 (H) 70 - 99 mg/dL   BUN 18 6 - 20 mg/dL  Creatinine, Ser 0.90 0.61 - 1.24 mg/dL   Calcium 8.2 (L) 8.9 - 10.3 mg/dL   GFR, Estimated >52 >84 mL/min   Anion gap 3 (L) 5 - 15  Glucose, capillary     Status: Abnormal   Collection Time: 11/06/20  8:36 AM  Result Value Ref Range   Glucose-Capillary 148 (H) 70 - 99 mg/dL  Sodium     Status: Abnormal   Collection Time: 11/06/20 10:00 AM  Result Value Ref Range   Sodium 150 (H) 135 - 145 mmol/L  Glucose, capillary     Status: Abnormal   Collection Time: 11/06/20 11:53 AM  Result Value Ref Range   Glucose-Capillary 132 (H) 70 - 99 mg/dL    Assessment & Plan: The plan of care was discussed with the bedside nurse for the day, who is in agreement with this plan and no additional concerns were raised.   Present on Admission: . Tobacco use disorder . Hypokalemia . Leukocytosis . Epistaxis . Closed head injury with petechial brain hemorrhage, with loss of consciousness of 1 hour to 5 hours 59 minutes, initial encounter (HCC)    LOS: 4 days   Additional comments:I reviewed the patient's new  clinical lab test results.   and I reviewed the patients new imaging test results.    [facility] vs [facility]  TBI with SAH, SDH, and cerebral edema, B parietal and R temporal skull FXs- Continue 3% saline per neurosurgery, likely d/c 5/24. Check sodium q6h. Management per Dr. Maisie Fus Acute hypoxic ventilator dependent respiratory failure - PSV trials Agitation: Continue precedex gtt, unable to maximize rate due to bradycardia. propofol gtt 5/21. Continue scheduled klonopin and seroquel per tube, increased seroquel dose today FEN- NPO, tube feeds are at goal VTE - SCDs, lovenox started 5/21 Dispo- ICU  Clinical update provided to the patient's wife at bedside.   Critical Care Total Time: 50 minutes  Diamantina Monks, MD Trauma & General Surgery Please use [email or web address] to contact on call provider  11/06/2020  *Care during the described time interval was provided by me. I have reviewed this patient's available data, including medical history, events of note, physical examination and test results as part of my evaluation.

## 2020-11-06 NOTE — Progress Notes (Signed)
Subjective: NAEs o/n  Objective: Vital signs in last 24 hours: Temp:  [98.2 F (36.8 C)-101.8 F (38.8 C)] 98.2 F (36.8 C) (05/23 0400) Pulse Rate:  [43-77] 77 (05/23 0800) Resp:  [17-20] 20 (05/23 0800) BP: (117-160)/(64-88) 160/88 (05/23 0800) SpO2:  [92 %-98 %] 92 % (05/23 0800) FiO2 (%):  [40 %] 40 % (05/23 0750) Weight:  [132.7 kg] 132.7 kg (05/23 0500)  Intake/Output from previous day: 05/22 0701 - 05/23 0700 In: 3449.3 [I.V.:1654.3; NG/GT:1595; IV Piggyback:200] Out: 1075 [Urine:1075] Intake/Output this shift: Total I/O In: 115.2 [I.V.:115.2] Out: 175 [Urine:175]  Intubated, sedated Eyes open to voice.  FC x 4 and centrally. PERRL.  Hands in restraints  Lab Results: Recent Labs    11/05/20 0400 11/06/20 0410  WBC 13.0* 11.4*  HGB 9.3* 9.4*  HCT 29.6* 29.6*  PLT 174 188   BMET Recent Labs    11/05/20 0400 11/05/20 0952 11/05/20 2158 11/06/20 0410  NA 151*   < > 149* 149*  K 3.7  --   --  3.4*  CL 118*  --   --  118*  CO2 29  --   --  28  GLUCOSE 143*  --   --  150*  BUN 20  --   --  18  CREATININE 1.00  --   --  0.90  CALCIUM 8.5*  --   --  8.2*   < > = values in this interval not displayed.    Studies/Results: CT HEAD WO CONTRAST  Result Date: 11/04/2020 CLINICAL DATA:  Head trauma. EXAM: CT HEAD WITHOUT CONTRAST TECHNIQUE: Contiguous axial images were obtained from the base of the skull through the vertex without intravenous contrast. COMPARISON:  CT head 11/03/2020 FINDINGS: Brain: Hemorrhagic contusion in the anterior frontal lobes left greater than right unchanged. Large hemorrhagic contusion right lateral temporal lobe unchanged. Small right temporal subdural hematoma unchanged. Mild interhemispheric subdural hematoma unchanged. Ventricle size normal.  No midline shift.  No new hemorrhage. Vascular: Negative for hyperdense vessel Skull: Fracture through the right lateral mastoid sinus extending into the parietal bone. Displaced fracture fragment  in the posterior parietal bone in the midline. Sinuses/Orbits: Mucosal edema in the paranasal sinuses. Air-fluid level sphenoid sinus. Negative orbit Other: Right facial and temporal soft tissue contusion. IMPRESSION: Contusions in the anterior frontal lobes unchanged. Hemorrhagic contusion right lateral temporal lobe unchanged. Mild right temporal subdural hematoma unchanged. Mild interhemispheric subdural hematoma unchanged. No new finding since the prior CT. Electronically Signed   By: Marlan Palau M.D.   On: 11/04/2020 15:46   DG CHEST PORT 1 VIEW  Result Date: 11/06/2020 CLINICAL DATA:  Respiratory distress. EXAM: PORTABLE CHEST 1 VIEW COMPARISON:  Chest x-ray 11/02/2020 FINDINGS: Endotracheal tube with tip terminating 4 cm above the carina. Enteric tube coursing below the hemidiaphragm with tip and side port collimated off view. Right PICC placement with tip overlying the right atrium. The heart size and mediastinal contours are within normal limits. Low lung volumes with interval development of streaky bibasilar airspace opacities. No focal consolidation. No pulmonary edema. No pleural effusion. No pneumothorax. No acute osseous abnormality. IMPRESSION: 1. Interval placement of a right PICC in appropriate position. 2. Endotracheal tube in stable position. 3. Enteric tube coursing below the hemidiaphragm with tip and side port collimated off view. 4. Low lung volumes with bibasilar atelectasis. Electronically Signed   By: Tish Frederickson M.D.   On: 11/06/2020 06:41    Assessment/Plan: [facility] with TBI, subfrontal and R temporal contusions -  will wean off 3%, goal Na+ now 140-145 - patient appears to have OSA but would appear to be a good candidate for ventilatory weaning - lovenox for DVT ppx  Antonio Higgins 11/06/2020, 8:42 AM

## 2020-11-06 NOTE — Progress Notes (Signed)
1600 sodium level resulted; spoke with Ihor Austin MD, orders to stop 3% at this time.

## 2020-11-06 NOTE — Progress Notes (Signed)
Transcranial Doppler  Date POD PCO2 HCT BP  MCA ACA PCA OPHT SIPH VERT Basilar  5/20 MR     Right  Left   60  71   -32  -29   *  16   31  24    36  82   *  *   *      5/23 RH     Right  Left   67  127   -58  -40   31  51   30  35   57  79   *  *   *           Right  Left                                             Right  Left                                             Right  Left                                            Right  Left                                            Right  Left                                        MCA = Middle Cerebral Artery      OPHT = Opthalmic Artery     BASILAR = Basilar Artery   ACA = Anterior Cerebral Artery     SIPH = Carotid Siphon PCA = Posterior Cerebral Artery   VERT = Verterbral Artery                   Normal MCA = 62+\-12 ACA = 50+\-12 PCA = 42+\-23   *- unable to insonate   RT Lindegaard = 1.18   LT Lindegaard = 2.70  05-01-1988, RDMS, RVT

## 2020-11-07 LAB — BASIC METABOLIC PANEL
Anion gap: 6 (ref 5–15)
BUN: 18 mg/dL (ref 6–20)
CO2: 29 mmol/L (ref 22–32)
Calcium: 8.3 mg/dL — ABNORMAL LOW (ref 8.9–10.3)
Chloride: 114 mmol/L — ABNORMAL HIGH (ref 98–111)
Creatinine, Ser: 0.86 mg/dL (ref 0.61–1.24)
GFR, Estimated: >60 mL/min (ref >60)
Glucose, Bld: 142 mg/dL — ABNORMAL HIGH (ref 70–99)
Potassium: 3.5 mmol/L (ref 3.5–5.1)
Sodium: 149 mmol/L — ABNORMAL HIGH (ref 135–145)

## 2020-11-07 LAB — CBC
HCT: 28.6 % — ABNORMAL LOW (ref 39.0–52.0)
Hemoglobin: 9.1 g/dL — ABNORMAL LOW (ref 13.0–17.0)
MCH: 31 pg (ref 26.0–34.0)
MCHC: 31.8 g/dL (ref 30.0–36.0)
MCV: 97.3 fL (ref 80.0–100.0)
Platelets: 201 10*3/uL (ref 150–400)
RBC: 2.94 MIL/uL — ABNORMAL LOW (ref 4.22–5.81)
RDW: 13 % (ref 11.5–15.5)
WBC: 12.2 10*3/uL — ABNORMAL HIGH (ref 4.0–10.5)
nRBC: 0.2 % (ref 0.0–0.2)

## 2020-11-07 LAB — GLUCOSE, CAPILLARY
Glucose-Capillary: 117 mg/dL — ABNORMAL HIGH (ref 70–99)
Glucose-Capillary: 117 mg/dL — ABNORMAL HIGH (ref 70–99)
Glucose-Capillary: 123 mg/dL — ABNORMAL HIGH (ref 70–99)
Glucose-Capillary: 129 mg/dL — ABNORMAL HIGH (ref 70–99)
Glucose-Capillary: 140 mg/dL — ABNORMAL HIGH (ref 70–99)
Glucose-Capillary: 159 mg/dL — ABNORMAL HIGH (ref 70–99)

## 2020-11-07 LAB — SODIUM
Sodium: 147 mmol/L — ABNORMAL HIGH (ref 135–145)
Sodium: 147 mmol/L — ABNORMAL HIGH (ref 135–145)

## 2020-11-07 MED ORDER — MIDAZOLAM HCL 2 MG/2ML IJ SOLN
1.0000 mg | INTRAMUSCULAR | Status: DC | PRN
  Administered 2020-11-07 – 2020-11-14 (×15): 2 mg via INTRAVENOUS
  Filled 2020-11-07 (×14): qty 2

## 2020-11-07 NOTE — Progress Notes (Signed)
Subjective: NAEs o/n.  3% has been stopped  Objective: Vital signs in last 24 hours: Temp:  [98.4 F (36.9 C)-99.4 F (37.4 C)] 99.4 F (37.4 C) (05/24 0800) Pulse Rate:  [54-82] 61 (05/24 0800) Resp:  [13-27] 15 (05/24 0800) BP: (125-166)/(68-89) 144/85 (05/24 0800) SpO2:  [88 %-100 %] 90 % (05/24 0843) FiO2 (%):  [40 %-50 %] 40 % (05/24 0843) Weight:  [131.7 kg] 131.7 kg (05/24 0500)  Intake/Output from previous day: 05/23 0701 - 05/24 0700 In: 2914.6 [I.V.:1089.8; YT/KZ:6010; IV Piggyback:199.8] Out: 2225 [Urine:2225] Intake/Output this shift: Total I/O In: 96.2 [I.V.:31.2; NG/GT:65] Out: -   Sedated on Fentanyl, Precedex Sleepy, eyes open to voice but sometimes requires stimulation for eye opening. Following simple commands intermittently MAEs symmetrically with good strength  Lab Results: Recent Labs    11/06/20 0410 11/07/20 0414  WBC 11.4* 12.2*  HGB 9.4* 9.1*  HCT 29.6* 28.6*  PLT 188 201   BMET Recent Labs    11/06/20 0410 11/06/20 1000 11/06/20 2133 11/07/20 0414  NA 149*   < > 150* 149*  K 3.4*  --   --  3.5  CL 118*  --   --  114*  CO2 28  --   --  29  GLUCOSE 150*  --   --  142*  BUN 18  --   --  18  CREATININE 0.90  --   --  0.86  CALCIUM 8.2*  --   --  8.3*   < > = values in this interval not displayed.    Studies/Results: DG CHEST PORT 1 VIEW  Result Date: 11/06/2020 CLINICAL DATA:  Respiratory distress. EXAM: PORTABLE CHEST 1 VIEW COMPARISON:  Chest x-ray 11/02/2020 FINDINGS: Endotracheal tube with tip terminating 4 cm above the carina. Enteric tube coursing below the hemidiaphragm with tip and side port collimated off view. Right PICC placement with tip overlying the right atrium. The heart size and mediastinal contours are within normal limits. Low lung volumes with interval development of streaky bibasilar airspace opacities. No focal consolidation. No pulmonary edema. No pleural effusion. No pneumothorax. No acute osseous abnormality.  IMPRESSION: 1. Interval placement of a right PICC in appropriate position. 2. Endotracheal tube in stable position. 3. Enteric tube coursing below the hemidiaphragm with tip and side port collimated off view. 4. Low lung volumes with bibasilar atelectasis. Electronically Signed   By: Tish Frederickson M.D.   On: 11/06/2020 06:41   DG Abd Portable 1V  Result Date: 11/06/2020 CLINICAL DATA:  Check feeding tube placement EXAM: PORTABLE ABDOMEN - 1 VIEW COMPARISON:  None. FINDINGS: Feeding catheter is noted in the distal stomach. Scattered large and small bowel gas is noted. No free air is seen. IMPRESSION: Feeding catheter within the distal stomach. Electronically Signed   By: Alcide Clever M.D.   On: 11/06/2020 13:29   VAS [place] TRANSCRANIAL DOPPLER  Result Date: 11/06/2020  Transcranial Doppler Patient Name:  Antonio Higgins  Date of Exam:   11/06/2020 Medical Rec #: [id number]     Accession #:    [id number] Date of Birth: 12/30/1985    Patient Gender: M Patient Age:   34Y Exam Location:  [facility] Procedure:      VAS [place] TRANSCRANIAL DOPPLER Referring Phys: [id number] AMY N COX --------------------------------------------------------------------------------  Indications: Subarachnoid hemorrhage. Limitations: Unable to insonate foramen due to collar and patient position. Comparison Study: 11-03-2020 Prior TCD study. Performing Technologist: Jean Rosenthal RDMS,RVT  Examination Guidelines: A complete evaluation includes B-mode imaging,  spectral Doppler, color Doppler, and power Doppler as needed of all accessible portions of each vessel. Bilateral testing is considered an integral part of a complete examination. Limited examinations for reoccurring indications may be performed as noted.  +----------+-------------+----------+-----------+------------------+ RIGHT TCD Right VM (cm)Depth (cm)Pulsatility     Comment       +----------+-------------+----------+-----------+------------------+ MCA           67.00                  0.89                       +----------+-------------+----------+-----------+------------------+ ACA          -58.00                 0.65                       +----------+-------------+----------+-----------+------------------+ Term ICA     123.00                 0.90                       +----------+-------------+----------+-----------+------------------+ PCA           31.00                 0.61                       +----------+-------------+----------+-----------+------------------+ Opthalmic     30.00                 1.37                       +----------+-------------+----------+-----------+------------------+ ICA siphon    57.00                 1.04                       +----------+-------------+----------+-----------+------------------+ Vertebral                                   Unable to insonate +----------+-------------+----------+-----------+------------------+ Distal ICA    57.00                 0.88                       +----------+-------------+----------+-----------+------------------+  +----------+------------+----------+-----------+------------------+ LEFT TCD  Left VM (cm)Depth (cm)Pulsatility     Comment       +----------+------------+----------+-----------+------------------+ MCA          127.00                0.91                       +----------+------------+----------+-----------+------------------+ ACA          -40.00                0.69                       +----------+------------+----------+-----------+------------------+ Term ICA     121.00                1.06                       +----------+------------+----------+-----------+------------------+  PCA          51.00                 0.84                       +----------+------------+----------+-----------+------------------+ Opthalmic    35.00                 1.29                        +----------+------------+----------+-----------+------------------+ ICA siphon   79.00                 1.13                       +----------+------------+----------+-----------+------------------+ Vertebral                                  Unable to insonate +----------+------------+----------+-----------+------------------+ Distal ICA   47.00                 0.82                       +----------+------------+----------+-----------+------------------+  +------------+-------+------------------+             VM cm/s     Comment       +------------+-------+------------------+ Prox Basilar       Unable to insonate +------------+-------+------------------+ +----------------------+----+ Right Lindegaard Ratio1.18 +----------------------+----+ +---------------------+----+ Left Lindegaard Ratio2.70 +---------------------+----+  Summary:  Elevated mean flow velocities in left middle cerebral artery and bilateral terminal internal carotid arteries suggestive of mild vasospasm.both vertebnal arteries not insonated due to technical difficulties. *See table(s) above for TCD measurements and observations.  Diagnosing physician: Delia Heady MD Electronically signed by Delia Heady MD on 11/06/2020 at 12:34:16 PM.    Final     Assessment/Plan: S/p [facility] with TBI including right temporal and subfrontal contusions - ventilatory wean - can change sodium checks to q12h for 24 hours, then daily - DVT ppx  [name] 11/07/2020, 10:14 AM

## 2020-11-07 NOTE — Progress Notes (Signed)
Trauma/Critical Care Follow Up Note  Subjective:    Overnight Issues:   Objective:  Vital signs for last 24 hours: Temp:  [98.4 F (36.9 C)-99.4 F (37.4 C)] 99.4 F (37.4 C) (05/24 0800) Pulse Rate:  [54-104] 79 (05/24 1000) Resp:  [13-31] 18 (05/24 1000) BP: (125-166)/(68-89) 134/74 (05/24 1000) SpO2:  [88 %-100 %] 92 % (05/24 1000) FiO2 (%):  [40 %-50 %] 40 % (05/24 0843) Weight:  [131.7 kg] 131.7 kg (05/24 0500)  Hemodynamic parameters for last 24 hours:    Intake/Output from previous day: 05/23 0701 - 05/24 0700 In: 2914.6 [I.V.:1089.8; XN/AT:5573; IV Piggyback:199.8] Out: 2225 [Urine:2225]  Intake/Output this shift: Total I/O In: 160.7 [I.V.:95.7; NG/GT:65] Out: -   Vent settings for last 24 hours: Vent Mode: PRVC FiO2 (%):  [40 %-50 %] 40 % Set Rate:  [18 bmp] 18 bmp Vt Set:  [650 mL] 650 mL PEEP:  [5 cmH20] 5 cmH20 Pressure Support:  [5 cmH20-10 cmH20] 10 cmH20 Plateau Pressure:  [16 cmH20-19 cmH20] 17 cmH20  Physical Exam:  Gen: comfortable, no distress Neuro: not f/c for me, but per Dr. Maisie Fus f/c HEENT: PERRL Neck: supple CV: RRR Pulm: unlabored breathing Abd: soft, NT GU: clear yellow urine Extr: wwp, no edema   Results for orders placed or performed during the hospital encounter of 11/02/20 (from the past 24 hour(s))  Glucose, capillary     Status: Abnormal   Collection Time: 11/06/20 11:53 AM  Result Value Ref Range   Glucose-Capillary 132 (H) 70 - 99 mg/dL  Glucose, capillary     Status: Abnormal   Collection Time: 11/06/20  3:48 PM  Result Value Ref Range   Glucose-Capillary 148 (H) 70 - 99 mg/dL  Sodium     Status: Abnormal   Collection Time: 11/06/20  4:24 PM  Result Value Ref Range   Sodium 149 (H) 135 - 145 mmol/L  Glucose, capillary     Status: Abnormal   Collection Time: 11/06/20  7:42 PM  Result Value Ref Range   Glucose-Capillary 128 (H) 70 - 99 mg/dL  Sodium     Status: Abnormal   Collection Time: 11/06/20  9:33 PM   Result Value Ref Range   Sodium 150 (H) 135 - 145 mmol/L  Glucose, capillary     Status: Abnormal   Collection Time: 11/06/20 11:33 PM  Result Value Ref Range   Glucose-Capillary 147 (H) 70 - 99 mg/dL  Glucose, capillary     Status: Abnormal   Collection Time: 11/07/20  3:41 AM  Result Value Ref Range   Glucose-Capillary 159 (H) 70 - 99 mg/dL  CBC     Status: Abnormal   Collection Time: 11/07/20  4:14 AM  Result Value Ref Range   WBC 12.2 (H) 4.0 - 10.5 K/uL   RBC 2.94 (L) 4.22 - 5.81 MIL/uL   Hemoglobin 9.1 (L) 13.0 - 17.0 g/dL   HCT 22.0 (L) 25.4 - 27.0 %   MCV 97.3 80.0 - 100.0 fL   MCH 31.0 26.0 - 34.0 pg   MCHC 31.8 30.0 - 36.0 g/dL   RDW 62.3 76.2 - 83.1 %   Platelets 201 150 - 400 K/uL   nRBC 0.2 0.0 - 0.2 %  Basic metabolic panel     Status: Abnormal   Collection Time: 11/07/20  4:14 AM  Result Value Ref Range   Sodium 149 (H) 135 - 145 mmol/L   Potassium 3.5 3.5 - 5.1 mmol/L   Chloride 114 (H) 98 -  111 mmol/L   CO2 29 22 - 32 mmol/L   Glucose, Bld 142 (H) 70 - 99 mg/dL   BUN 18 6 - 20 mg/dL   Creatinine, Ser 5.63 0.61 - 1.24 mg/dL   Calcium 8.3 (L) 8.9 - 10.3 mg/dL   GFR, Estimated >87 >56 mL/min   Anion gap 6 5 - 15  Glucose, capillary     Status: Abnormal   Collection Time: 11/07/20  8:13 AM  Result Value Ref Range   Glucose-Capillary 117 (H) 70 - 99 mg/dL    Assessment & Plan: The plan of care was discussed with the bedside nurse for the day, who is in agreement with this plan and no additional concerns were raised.   Present on Admission: . Tobacco use disorder . Hypokalemia . Leukocytosis . Epistaxis . Closed head injury with petechial brain hemorrhage, with loss of consciousness of 1 hour to 5 hours 59 minutes, initial encounter (HCC)    LOS: 5 days   Additional comments:I reviewed the patient's new clinical lab test results.   and I reviewed the patients new imaging test results.    [facility] vs [facility]  TBI with SAH, SDH, and cerebral edema, B  parietal and R temporal skull FXs- off 3% saline per neurosurgery. Keppra x7d Acute hypoxic ventilator dependent respiratory failure- PSV trials, not appropriate for extubation today Agitation- adjust precedex/propofol for sedation and bradycardia. Continue scheduled klonopin and seroquel per tube FEN- NPO, tube feeds at goal VTE- SCDs, lovenox Dispo- ICU  Critical Care Total Time: 40 minutes  Diamantina Monks, MD Trauma & General Surgery Please use [email or web address] to contact on call provider  11/07/2020  *Care during the described time interval was provided by me. I have reviewed this patient's available data, including medical history, events of note, physical examination and test results as part of my evaluation.

## 2020-11-08 ENCOUNTER — Inpatient Hospital Stay (HOSPITAL_COMMUNITY): Payer: No Typology Code available for payment source

## 2020-11-08 DIAGNOSIS — I609 Nontraumatic subarachnoid hemorrhage, unspecified: Secondary | ICD-10-CM

## 2020-11-08 LAB — POCT I-STAT 7, (LYTES, BLD GAS, ICA,H+H)
Acid-Base Excess: 6 mmol/L — ABNORMAL HIGH (ref 0.0–2.0)
Bicarbonate: 30.3 mmol/L — ABNORMAL HIGH (ref 20.0–28.0)
Calcium, Ion: 1.17 mmol/L (ref 1.15–1.40)
HCT: 29 % — ABNORMAL LOW (ref 39.0–52.0)
Hemoglobin: 9.9 g/dL — ABNORMAL LOW (ref 13.0–17.0)
O2 Saturation: 100 %
Patient temperature: 99.5
Potassium: 3.8 mmol/L (ref 3.5–5.1)
Sodium: 145 mmol/L (ref 135–145)
TCO2: 32 mmol/L (ref 22–32)
pCO2 arterial: 45.1 mmHg (ref 32.0–48.0)
pH, Arterial: 7.437 (ref 7.350–7.450)
pO2, Arterial: 186 mmHg — ABNORMAL HIGH (ref 83.0–108.0)

## 2020-11-08 LAB — GLUCOSE, CAPILLARY
Glucose-Capillary: 111 mg/dL — ABNORMAL HIGH (ref 70–99)
Glucose-Capillary: 120 mg/dL — ABNORMAL HIGH (ref 70–99)
Glucose-Capillary: 124 mg/dL — ABNORMAL HIGH (ref 70–99)
Glucose-Capillary: 131 mg/dL — ABNORMAL HIGH (ref 70–99)
Glucose-Capillary: 149 mg/dL — ABNORMAL HIGH (ref 70–99)
Glucose-Capillary: 152 mg/dL — ABNORMAL HIGH (ref 70–99)

## 2020-11-08 LAB — SODIUM
Sodium: 143 mmol/L (ref 135–145)
Sodium: 139 mmol/L (ref 135–145)

## 2020-11-08 MED ORDER — ETOMIDATE 2 MG/ML IV SOLN
20.0000 mg | Freq: Once | INTRAVENOUS | Status: AC
  Administered 2020-11-08: 20 mg via INTRAVENOUS
  Filled 2020-11-08: qty 10

## 2020-11-08 MED ORDER — DEXAMETHASONE SODIUM PHOSPHATE 10 MG/ML IJ SOLN
8.0000 mg | Freq: Once | INTRAMUSCULAR | Status: AC
  Administered 2020-11-08: 8 mg via INTRAVENOUS

## 2020-11-08 MED ORDER — DEXAMETHASONE SODIUM PHOSPHATE 10 MG/ML IJ SOLN
INTRAMUSCULAR | Status: AC
  Filled 2020-11-08: qty 1

## 2020-11-08 MED ORDER — SUCCINYLCHOLINE CHLORIDE 20 MG/ML IJ SOLN
100.0000 mg | Freq: Once | INTRAMUSCULAR | Status: AC
  Administered 2020-11-08: 100 mg via INTRAVENOUS
  Filled 2020-11-08 (×4): qty 5

## 2020-11-08 MED ORDER — MIDAZOLAM HCL 2 MG/2ML IJ SOLN
INTRAMUSCULAR | Status: AC
  Filled 2020-11-08: qty 2

## 2020-11-08 MED ORDER — GUAIFENESIN 100 MG/5ML PO SOLN
10.0000 mL | ORAL | Status: DC
  Filled 2020-11-08: qty 15

## 2020-11-08 MED ORDER — ETOMIDATE 2 MG/ML IV SOLN
INTRAVENOUS | Status: AC
  Filled 2020-11-08: qty 20

## 2020-11-08 MED ORDER — RACEPINEPHRINE HCL 2.25 % IN NEBU
0.5000 mL | INHALATION_SOLUTION | RESPIRATORY_TRACT | Status: DC | PRN
  Administered 2020-11-08: 0.5 mL via RESPIRATORY_TRACT
  Filled 2020-11-08: qty 0.5

## 2020-11-08 MED ORDER — ROCURONIUM BROMIDE 10 MG/ML (PF) SYRINGE
PREFILLED_SYRINGE | INTRAVENOUS | Status: AC
  Filled 2020-11-08: qty 10

## 2020-11-08 MED ORDER — GUAIFENESIN 100 MG/5ML PO SOLN
10.0000 mL | ORAL | Status: DC
  Administered 2020-11-08 – 2020-11-14 (×37): 200 mg
  Filled 2020-11-08 (×36): qty 15

## 2020-11-08 MED ORDER — FUROSEMIDE 10 MG/ML IJ SOLN
40.0000 mg | Freq: Once | INTRAMUSCULAR | Status: AC
  Administered 2020-11-08: 40 mg via INTRAVENOUS
  Filled 2020-11-08: qty 4

## 2020-11-08 MED ORDER — FENTANYL CITRATE (PF) 100 MCG/2ML IJ SOLN
INTRAMUSCULAR | Status: AC
  Filled 2020-11-08: qty 2

## 2020-11-08 NOTE — Progress Notes (Signed)
Patient seen and examined after extubation. Weak cough, unable to audibly vocalize but responsive to questions. Normal vitals. Increased WOB without stridor, but sounds hoarse. Racemic x1 admin and lasix. Discussed with family the possibility of re-intubation if no improvement. Continued to monitor clinically and some improvement with improved cough, audible vocalization, verbalization of physical needs (urination/BM), decreased WOB and hoarseness. Robust response to lasix. Continue to monitor clinically with low threshold for re-intubation.   Additional critical care time:  Diamantina Monks, MD General and Trauma Surgery [facility] Surgery

## 2020-11-08 NOTE — Progress Notes (Signed)
Increased WOB again, decision made to reintubate. Discussed with patient's wife who is in agreement. Will send resp cx after intubation.   Additional critical care time:  Diamantina Monks, MD General and Trauma Surgery [facility] Surgery
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
## 2020-11-08 NOTE — Progress Notes (Signed)
1345:  Patient's work of breathing more labor and patient is more restless, RT and Trauma MD notified.  New orders received per Trauma MD.  Patient currently on 6 liters nasal cannula. RN to continue to monitor patient's respiratory status.

## 2020-11-08 NOTE — Progress Notes (Signed)
Transcranial Doppler  Date POD PCO2 HCT BP  MCA ACA PCA OPHT SIPH VERT Basilar  5/20 MR     Right  Left   60  71   -32  -29   *  16   31  24    36  82   *  *   *      5/23 RH     Right  Left   67  127   -58  -40   31  51   30  35   57  79   *  *   *      5/25 RH     Right  Left   59  65   -21  -27   37  36   26  34   60  45   *  -38   *  -56          Right  Left                                             Right  Left                                            Right  Left                                            Right  Left                                        MCA = Middle Cerebral Artery OPHT = Opthalmic Artery BASILAR = Basilar Artery  ACA = Anterior Cerebral Artery SIPH = Carotid Siphon PCA = Posterior Cerebral Artery VERT = Verterbral Artery   Normal MCA = 62+\-12 ACA = 50+\-12 PCA = 42+\-23   *- unable to insonate  RT Lindegaard = 2.11  LT Lindegaard = 2.83  6/25, RDMS, RVT and [place], RDMS, RVT
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
## 2020-11-08 NOTE — Progress Notes (Signed)
Subjective: NAEs o/n  Objective: Vital signs in last 24 hours: Temp:  [99.1 F (37.3 C)-101.8 F (38.8 C)] 100.8 F (38.2 C) (05/25 1200) Pulse Rate:  [57-94] 64 (05/25 1500) Resp:  [16-28] 24 (05/25 1500) BP: (114-173)/(66-103) 168/103 (05/25 1500) SpO2:  [92 %-98 %] 95 % (05/25 1500) FiO2 (%):  [40 %] 40 % (05/25 1122) Weight:  [134.3 kg] 134.3 kg (05/25 0500)  Intake/Output from previous day: 05/24 0701 - 05/25 0700 In: 2507.6 [I.V.:820.3; KG/MW:1027.2; IV Piggyback:200] Out: 1940 [Urine:1940] Intake/Output this shift: Total I/O In: 659 [I.V.:226.3; NG/GT:332.7; IV Piggyback:100] Out: 2550 [Urine:2550]  Patient examined this morning.  Intubated, eyes open spont, FC x 4 and centrally briskly.  Hands restrained.    Lab Results: Recent Labs    11/06/20 0410 11/07/20 0414  WBC 11.4* 12.2*  HGB 9.4* 9.1*  HCT 29.6* 28.6*  PLT 188 201   BMET Recent Labs    11/06/20 0410 11/06/20 1000 11/07/20 0414 11/07/20 1218 11/07/20 2157 11/08/20 1034  NA 149*   < > 149*   < > 147* 143  K 3.4*  --  3.5  --   --   --   CL 118*  --  114*  --   --   --   CO2 28  --  29  --   --   --   GLUCOSE 150*  --  142*  --   --   --   BUN 18  --  18  --   --   --   CREATININE 0.90  --  0.86  --   --   --   CALCIUM 8.2*  --  8.3*  --   --   --    < > = values in this interval not displayed.    Studies/Results: VAS [place] TRANSCRANIAL DOPPLER  Result Date: 11/08/2020  Transcranial Doppler Patient Name:  Antonio Higgins  Date of Exam:   11/08/2020 Medical Rec #: [id number]     Accession #:    [id number] Date of Birth: [birth year]    Patient Gender: M Patient Age:   34Y Exam Location:  [facility] Procedure:      VAS [place] TRANSCRANIAL DOPPLER Referring Phys: [id number] AMY N COX --------------------------------------------------------------------------------  Indications: Subarachnoid hemorrhage. Comparison Study: Most recent prior 11-06-2020. Performing Technologist: Ernestene Mention RDMS, RVT  Supporting Technologist: Jean Rosenthal RDMS,RVT  Examination Guidelines: A complete evaluation includes B-mode imaging, spectral Doppler, color Doppler, and power Doppler as needed of all accessible portions of each vessel. Bilateral testing is considered an integral part of a complete examination. Limited examinations for reoccurring indications may be performed as noted.  +----------+-------------+----------+-----------+------------------------------+ RIGHT TCD Right VM (cm)Depth (cm)Pulsatility           Comment             +----------+-------------+----------+-----------+------------------------------+ MCA           59.00                 1.10                                   +----------+-------------+----------+-----------+------------------------------+ ACA          -21.00                 0.84                                   +----------+-------------+----------+-----------+------------------------------+  Term ICA      68.00                 0.85                                   +----------+-------------+----------+-----------+------------------------------+ PCA           37.00                 1.08                                   +----------+-------------+----------+-----------+------------------------------+ Opthalmic     26.00                 1.16                                   +----------+-------------+----------+-----------+------------------------------+ ICA siphon    60.00                 1.00                                   +----------+-------------+----------+-----------+------------------------------+ Vertebral                                    Unable to insonate- patient                                                   position and agitation     +----------+-------------+----------+-----------+------------------------------+ Distal ICA    28.00                 1.06                                    +----------+-------------+----------+-----------+------------------------------+  +----------+------------+----------+-----------+-------+ LEFT TCD  Left VM (cm)Depth (cm)PulsatilityComment +----------+------------+----------+-----------+-------+ MCA          65.00                 0.86            +----------+------------+----------+-----------+-------+ ACA          -27.00                0.92            +----------+------------+----------+-----------+-------+ Term ICA     35.00                 1.08            +----------+------------+----------+-----------+-------+ PCA          36.00                 1.06            +----------+------------+----------+-----------+-------+ Opthalmic    34.00                 1.36            +----------+------------+----------+-----------+-------+ ICA siphon  45.00                 1.30            +----------+------------+----------+-----------+-------+ Vertebral    -38.00                0.78            +----------+------------+----------+-----------+-------+ Distal ICA   23.00                 0.85            +----------+------------+----------+-----------+-------+  +------------+-------+-------+             VM cm/sComment +------------+-------+-------+ Prox Basilar-47.00         +------------+-------+-------+ Dist Basilar-56.00         +------------+-------+-------+ +----------------------+----+ Right Lindegaard Ratio2.11 +----------------------+----+ +---------------------+----+ Left Lindegaard Ratio2.83 +---------------------+----+    Preliminary     Assessment/Plan: S/p [facility] - likely extubation today - q12h Na+ checks   [name] 11/08/2020, 3:37 PM

## 2020-11-08 NOTE — Procedures (Signed)
Intubation Procedure Note  Antonio Higgins  [id number]  Nov 03, 1985  Date:11/08/20  Time:5:49 PM   Provider Performing:Kahleb Mcclane N Kimberli Winne    Procedure: Intubation (31500)  Indication(s) Respiratory Failure  Consent Risks of the procedure as well as the alternatives and risks of each were explained to the patient and/or caregiver.  Consent for the procedure was obtained and is signed in the bedside chart   Anesthesia Etomidate and Succinylcholine   Time Out Verified patient identification, verified procedure, site/side was marked, verified correct patient position, special equipment/implants available, medications/allergies/relevant history reviewed, required imaging and test results available.   Sterile Technique Usual hand hygeine, masks, and gloves were used   Procedure Description Patient positioned in bed supine.  Sedation given as noted above.  Patient was intubated with endotracheal tube using Glidescope.  View was Grade 2 only posterior commissure .  Number of attempts was 1.  Colorimetric CO2 detector was consistent with tracheal placement.   Complications/Tolerance None; patient tolerated the procedure well. Chest X-ray is ordered to verify placement.   EBL Minimal   Specimen(s) None

## 2020-11-08 NOTE — Procedures (Signed)
Extubation Procedure Note  Patient Details:   Name: Antonio Higgins DOB: [birth year] MRN: [id number]   Airway Documentation:    Vent end date: 11/08/20 Vent end time: 1235   Evaluation  O2 sats: stable throughout Complications: No apparent complications Patient did tolerate procedure well. Bilateral Breath Sounds: Clear,Diminished   Yes   Patient was extubated to a 4L [place] without any complications, dyspnea or stridor noted.   Carlynn Spry 11/08/2020, 12:35 PM

## 2020-11-09 LAB — CBC
HCT: 21.8 % — ABNORMAL LOW (ref 39.0–52.0)
Hemoglobin: 7.7 g/dL — ABNORMAL LOW (ref 13.0–17.0)
MCH: 34.2 pg — ABNORMAL HIGH (ref 26.0–34.0)
MCHC: 35.3 g/dL (ref 30.0–36.0)
MCV: 96.9 fL (ref 80.0–100.0)
Platelets: 150 10*3/uL (ref 150–400)
RBC: 2.25 MIL/uL — ABNORMAL LOW (ref 4.22–5.81)
RDW: 12.4 % (ref 11.5–15.5)
WBC: 9.9 10*3/uL (ref 4.0–10.5)
nRBC: 0 % (ref 0.0–0.2)

## 2020-11-09 LAB — GLUCOSE, CAPILLARY
Glucose-Capillary: 111 mg/dL — ABNORMAL HIGH (ref 70–99)
Glucose-Capillary: 114 mg/dL — ABNORMAL HIGH (ref 70–99)
Glucose-Capillary: 132 mg/dL — ABNORMAL HIGH (ref 70–99)
Glucose-Capillary: 133 mg/dL — ABNORMAL HIGH (ref 70–99)
Glucose-Capillary: 136 mg/dL — ABNORMAL HIGH (ref 70–99)
Glucose-Capillary: 153 mg/dL — ABNORMAL HIGH (ref 70–99)

## 2020-11-09 LAB — BASIC METABOLIC PANEL
Anion gap: 4 — ABNORMAL LOW (ref 5–15)
BUN: 24 mg/dL — ABNORMAL HIGH (ref 6–20)
CO2: 26 mmol/L (ref 22–32)
Calcium: 7 mg/dL — ABNORMAL LOW (ref 8.9–10.3)
Chloride: 105 mmol/L (ref 98–111)
Creatinine, Ser: 0.71 mg/dL (ref 0.61–1.24)
GFR, Estimated: >60 mL/min (ref >60)
Glucose, Bld: 131 mg/dL — ABNORMAL HIGH (ref 70–99)
Potassium: 3.4 mmol/L — ABNORMAL LOW (ref 3.5–5.1)
Sodium: 135 mmol/L (ref 135–145)

## 2020-11-09 LAB — SODIUM
Sodium: 138 mmol/L (ref 135–145)
Sodium: 143 mmol/L (ref 135–145)

## 2020-11-09 MED ORDER — POTASSIUM CHLORIDE 20 MEQ PO PACK
40.0000 meq | PACK | ORAL | Status: AC
  Administered 2020-11-09 (×3): 40 meq
  Filled 2020-11-09 (×3): qty 2

## 2020-11-09 MED ORDER — SODIUM CHLORIDE 3 % IV SOLN
INTRAVENOUS | Status: DC
  Filled 2020-11-09: qty 500

## 2020-11-09 MED ORDER — SODIUM CHLORIDE 3 % IN NEBU
4.0000 mL | INHALATION_SOLUTION | RESPIRATORY_TRACT | Status: AC
Start: 2020-11-09 — End: 2020-11-12
  Administered 2020-11-09 – 2020-11-12 (×17): 4 mL via RESPIRATORY_TRACT
  Filled 2020-11-09 (×18): qty 4

## 2020-11-09 MED ORDER — SODIUM CHLORIDE 0.9 % IV SOLN
2.0000 g | Freq: Three times a day (TID) | INTRAVENOUS | Status: DC
  Administered 2020-11-09 – 2020-11-10 (×3): 2 g via INTRAVENOUS
  Filled 2020-11-09 (×3): qty 2

## 2020-11-09 MED ORDER — FUROSEMIDE 10 MG/ML IJ SOLN
40.0000 mg | Freq: Once | INTRAMUSCULAR | Status: AC
  Administered 2020-11-09: 40 mg via INTRAVENOUS
  Filled 2020-11-09: qty 4

## 2020-11-09 NOTE — Progress Notes (Signed)
Patient's sodium level was 143. Order set was to notify physician if Na level was <150 or >160. Spoke with Dr. Jake Samples. No changes were made to the rate of 3% fluids. Parameters were changed to notify physician if sodium level was >150 or <135.   Harriett Sine, RN

## 2020-11-09 NOTE — TOC Progression Note (Signed)
Transition of Care [facility]) - Progression Note    Patient Details  Name: Antonio Higgins MRN: [id number] Date of Birth: 07/26/1985  Transition of Care [facility]) CM/SW Contact  [name] Bodo, RN Phone Number: 11/09/2020, 11:50am  Clinical Narrative:   Met with pt's wife, Vikki Ports; introduced myself and explained Case Manager role.  Wife concerned about pt applying for Medicaid and disability.  Able to speak with financial counselor, Wilburn Cornelia, on speaker phone:  She was able to answer all of wife's questions regarding [place]/Disability.  She states that pt will be referred to [facility] in [place] for follow up.  Wife appreciative of my visit and assistance with financial issues.  Will continue to follow for discharge planning as pt progresses.     Expected Discharge Plan: IP Rehab Facility Barriers to Discharge: Continued Medical Work up  Expected Discharge Plan and Services Expected Discharge Plan: [place]   Discharge Planning Services: CM Consult,Other - See comment (Financial Counseling)   Living arrangements for the past 2 months: Single Family Home                                       Social Determinants of Health (SDOH) Interventions    Readmission Risk Interventions No flowsheet data found.  Reinaldo Raddle, RN, BSN  Trauma/Neuro ICU Case Manager [phone]
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
## 2020-11-09 NOTE — Progress Notes (Signed)
Subjective: Patient required reintubation yesterday  Objective: Vital signs in last 24 hours: Temp:  [98.5 F (36.9 C)-100.8 F (38.2 C)] 100.2 F (37.9 C) (05/26 0400) Pulse Rate:  [60-111] 79 (05/26 0800) Resp:  [10-30] 18 (05/26 0800) BP: (105-173)/(63-138) 133/81 (05/26 0700) SpO2:  [92 %-100 %] 92 % (05/26 0800) FiO2 (%):  [40 %-100 %] 70 % (05/26 0409) Weight:  [134.5 kg] 134.5 kg (05/26 0500)  Intake/Output from previous day: 05/25 0701 - 05/26 0700 In: 2311.9 [I.V.:999.2; NG/GT:1112.7; IV Piggyback:200] Out: 4250 [Urine:4250] Intake/Output this shift: No intake/output data recorded.  Eyes open to voice, FC x 4 and centrally.  Restrained.  Intubated.  Lab Results: Recent Labs    11/07/20 0414 11/08/20 2120 11/09/20 0441  WBC 12.2*  --  9.9  HGB 9.1* 9.9* 7.7*  HCT 28.6* 29.0* 21.8*  PLT 201  --  150   BMET Recent Labs    11/07/20 0414 11/07/20 1218 11/08/20 2120 11/08/20 2159 11/09/20 0441  NA 149*   < > 145 139 135  K 3.5  --  3.8  --  3.4*  CL 114*  --   --   --  105  CO2 29  --   --   --  26  GLUCOSE 142*  --   --   --  131*  BUN 18  --   --   --  24*  CREATININE 0.86  --   --   --  0.71  CALCIUM 8.3*  --   --   --  7.0*   < > = values in this interval not displayed.    Studies/Results: DG CHEST PORT 1 VIEW  Result Date: 11/08/2020 CLINICAL DATA:  Endotracheal tube placement. EXAM: PORTABLE CHEST 1 VIEW COMPARISON:  Nov 06, 2020. FINDINGS: The heart size and mediastinal contours are within normal limits. Endotracheal and feeding tubes are in good position. Right-sided PICC line is unchanged. No pneumothorax is noted. Bibasilar atelectasis or infiltrates are noted with probable small pleural effusions. The visualized skeletal structures are unremarkable. IMPRESSION: Endotracheal and feeding tube in grossly good position. Bibasilar atelectasis or infiltrates are noted with probable small pleural effusions. Electronically Signed   By: Lupita Raider  M.D.   On: 11/08/2020 19:08   VAS [place] TRANSCRANIAL DOPPLER  Result Date: 11/08/2020  Transcranial Doppler Patient Name:  Antonio Higgins  Date of Exam:   11/08/2020 Medical Rec #: [id number]     Accession #:    [id number] Date of Birth: October 03, 1985    Patient Gender: M Patient Age:   35Y Exam Location:  [facility] Procedure:      VAS [place] TRANSCRANIAL DOPPLER Referring Phys: [id number] AMY N COX --------------------------------------------------------------------------------  Indications: Subarachnoid hemorrhage. Comparison Study: Most recent prior 11-06-2020. Performing Technologist: Ernestene Mention RDMS, RVT Supporting Technologist: Jean Rosenthal RDMS,RVT  Examination Guidelines: A complete evaluation includes B-mode imaging, spectral Doppler, color Doppler, and power Doppler as needed of all accessible portions of each vessel. Bilateral testing is considered an integral part of a complete examination. Limited examinations for reoccurring indications may be performed as noted.  +----------+-------------+----------+-----------+------------------------------+ RIGHT TCD Right VM (cm)Depth (cm)Pulsatility           Comment             +----------+-------------+----------+-----------+------------------------------+ MCA           59.00                 1.10                                   +----------+-------------+----------+-----------+------------------------------+  ACA          -21.00                 0.84                                   +----------+-------------+----------+-----------+------------------------------+ Term ICA      68.00                 0.85                                   +----------+-------------+----------+-----------+------------------------------+ PCA           37.00                 1.08                                   +----------+-------------+----------+-----------+------------------------------+ Opthalmic     26.00                 1.16                                    +----------+-------------+----------+-----------+------------------------------+ ICA siphon    60.00                 1.00                                   +----------+-------------+----------+-----------+------------------------------+ Vertebral                                    Unable to insonate- patient                                                   position and agitation     +----------+-------------+----------+-----------+------------------------------+ Distal ICA    28.00                 1.06                                   +----------+-------------+----------+-----------+------------------------------+  +----------+------------+----------+-----------+-------+ LEFT TCD  Left VM (cm)Depth (cm)PulsatilityComment +----------+------------+----------+-----------+-------+ MCA          65.00                 0.86            +----------+------------+----------+-----------+-------+ ACA          -27.00                0.92            +----------+------------+----------+-----------+-------+ Term ICA     35.00                 1.08            +----------+------------+----------+-----------+-------+ PCA          36.00  1.06            +----------+------------+----------+-----------+-------+ Opthalmic    34.00                 1.36            +----------+------------+----------+-----------+-------+ ICA siphon   45.00                 1.30            +----------+------------+----------+-----------+-------+ Vertebral    -38.00                0.78            +----------+------------+----------+-----------+-------+ Distal ICA   23.00                 0.85            +----------+------------+----------+-----------+-------+  +------------+-------+-------+             VM cm/sComment +------------+-------+-------+ Prox Basilar-47.00         +------------+-------+-------+ Dist Basilar-56.00          +------------+-------+-------+ +----------------------+----+ Right Lindegaard Ratio2.11 +----------------------+----+ +---------------------+----+ Left Lindegaard Ratio2.83 +---------------------+----+    Preliminary     Assessment/Plan: 35 yo M with TBI, right temporal and subfrontal contusions - continue attempts at weaning - Na+ has dropped to 135.  Will restart 3% at 12.5 ml/hr, check Na+ q6h   [name] 11/09/2020, 8:11 AM

## 2020-11-10 ENCOUNTER — Inpatient Hospital Stay (HOSPITAL_COMMUNITY): Payer: No Typology Code available for payment source

## 2020-11-10 DIAGNOSIS — I609 Nontraumatic subarachnoid hemorrhage, unspecified: Secondary | ICD-10-CM

## 2020-11-10 LAB — GLUCOSE, CAPILLARY
Glucose-Capillary: 107 mg/dL — ABNORMAL HIGH (ref 70–99)
Glucose-Capillary: 108 mg/dL — ABNORMAL HIGH (ref 70–99)
Glucose-Capillary: 134 mg/dL — ABNORMAL HIGH (ref 70–99)
Glucose-Capillary: 140 mg/dL — ABNORMAL HIGH (ref 70–99)
Glucose-Capillary: 147 mg/dL — ABNORMAL HIGH (ref 70–99)
Glucose-Capillary: 147 mg/dL — ABNORMAL HIGH (ref 70–99)

## 2020-11-10 LAB — SODIUM
Sodium: 143 mmol/L (ref 135–145)
Sodium: 142 mmol/L (ref 135–145)
Sodium: 140 mmol/L (ref 135–145)
Sodium: 140 mmol/L (ref 135–145)

## 2020-11-10 LAB — BASIC METABOLIC PANEL
Anion gap: 8 (ref 5–15)
BUN: 27 mg/dL — ABNORMAL HIGH (ref 6–20)
CO2: 24 mmol/L (ref 22–32)
Calcium: 7.4 mg/dL — ABNORMAL LOW (ref 8.9–10.3)
Chloride: 110 mmol/L (ref 98–111)
Creatinine, Ser: 0.85 mg/dL (ref 0.61–1.24)
GFR, Estimated: >60 mL/min (ref >60)
Glucose, Bld: 109 mg/dL — ABNORMAL HIGH (ref 70–99)
Potassium: 4.1 mmol/L (ref 3.5–5.1)
Sodium: 142 mmol/L (ref 135–145)

## 2020-11-10 LAB — CBC
HCT: 28.4 % — ABNORMAL LOW (ref 39.0–52.0)
Hemoglobin: 9.4 g/dL — ABNORMAL LOW (ref 13.0–17.0)
MCH: 32.4 pg (ref 26.0–34.0)
MCHC: 33.1 g/dL (ref 30.0–36.0)
MCV: 97.9 fL (ref 80.0–100.0)
Platelets: 209 10*3/uL (ref 150–400)
RBC: 2.9 MIL/uL — ABNORMAL LOW (ref 4.22–5.81)
RDW: 12.7 % (ref 11.5–15.5)
WBC: 11.8 10*3/uL — ABNORMAL HIGH (ref 4.0–10.5)
nRBC: 0 % (ref 0.0–0.2)

## 2020-11-10 LAB — TRIGLYCERIDES
Triglycerides: 2027 mg/dL — ABNORMAL HIGH (ref <150)
Triglycerides: 901 mg/dL — ABNORMAL HIGH (ref <150)

## 2020-11-10 MED ORDER — AMOXICILLIN-POT CLAVULANATE 400-57 MG/5ML PO SUSR
875.0000 mg | Freq: Two times a day (BID) | ORAL | Status: DC
  Administered 2020-11-10 – 2020-11-14 (×8): 875 mg
  Filled 2020-11-10 (×10): qty 10.9

## 2020-11-10 MED ORDER — AMOXICILLIN 250 MG/5ML PO SUSR
500.0000 mg | Freq: Three times a day (TID) | ORAL | Status: DC
  Administered 2020-11-10: 500 mg
  Filled 2020-11-10 (×2): qty 10

## 2020-11-10 MED ORDER — DEXMEDETOMIDINE HCL IN NACL 400 MCG/100ML IV SOLN
0.0000 ug/kg/h | INTRAVENOUS | Status: DC
  Administered 2020-11-10: 1 ug/kg/h via INTRAVENOUS
  Administered 2020-11-10 – 2020-11-13 (×26): 1.2 ug/kg/h via INTRAVENOUS
  Administered 2020-11-14: 1.1 ug/kg/h via INTRAVENOUS
  Administered 2020-11-14: 1.2 ug/kg/h via INTRAVENOUS
  Filled 2020-11-10 (×13): qty 100
  Filled 2020-11-10: qty 200
  Filled 2020-11-10 (×6): qty 100
  Filled 2020-11-10: qty 200
  Filled 2020-11-10 (×3): qty 100
  Filled 2020-11-10 (×2): qty 200
  Filled 2020-11-10: qty 100

## 2020-11-10 MED ORDER — SODIUM CHLORIDE 1 G PO TABS
2.0000 g | ORAL_TABLET | Freq: Three times a day (TID) | ORAL | Status: DC
  Administered 2020-11-10 – 2020-11-14 (×13): 2 g
  Filled 2020-11-10 (×13): qty 2
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
## 2020-11-10 NOTE — Progress Notes (Signed)
Transcranial Doppler  Date POD PCO2 HCT BP  MCA ACA PCA OPHT SIPH VERT Basilar  5/20 MR     Right  Left   60  71   -32  -29   *  16   31  24    36  82   *  *   *      5/23 RH     Right  Left   67  127   -58  -40   31  51   30  35   57  79   *  *   *      5/25 RH     Right  Left   59  65   -21  -27   37  36   26  34   60  45   *  -38   *  -56    5/27 [facility]      Right  Left   132  115   -30.7  -35.8   69.9  48.6   71.6  42.60   60.5  45.2   -38.4  -47.7   -83.6  *          Right  Left                                            Right  Left                                            Right  Left                                        MCA = Middle Cerebral Artery OPHT = Opthalmic Artery BASILAR = Basilar Artery  ACA = Anterior Cerebral Artery SIPH = Carotid Siphon PCA = Posterior Cerebral Artery VERT = Verterbral Artery   Normal MCA = 62+\-12 ACA = 50+\-12 PCA = 42+\-23   *- unable to insonate  RT Lindegaard = 2.50  LT Lindegaard = 2.25  Results can be found under chart review under CV PROC. 11/10/2020 4:15 PM Nagee Goates RVT, RDMS 132

## 2020-11-10 NOTE — Progress Notes (Signed)
Subjective: NAEs o/n  Objective: Vital signs in last 24 hours: Temp:  [99.7 F (37.6 C)-100.8 F (38.2 C)] 99.7 F (37.6 C) (05/27 0800) Pulse Rate:  [67-108] 88 (05/27 0812) Resp:  [16-22] 19 (05/27 0812) BP: (100-128)/(58-72) 115/71 (05/27 0812) SpO2:  [89 %-96 %] 94 % (05/27 0812) FiO2 (%):  [40 %-50 %] 40 % (05/27 0812) Weight:  [128.4 kg] 128.4 kg (05/27 0359)  Intake/Output from previous day: 05/26 0701 - 05/27 0700 In: 3506.8 [I.V.:1646.9; NG/GT:1560; IV Piggyback:299.9] Out: 2500 [Urine:2500] Intake/Output this shift: Total I/O In: 95 [I.V.:30; NG/GT:65] Out: -  Intubated Eyes open to voice, PERRL FC x 4 and centrally briskly  Lab Results: Recent Labs    11/09/20 0441 11/10/20 0500  WBC 9.9 11.8*  HGB 7.7* 9.4*  HCT 21.8* 28.4*  PLT 150 209   BMET Recent Labs    11/09/20 0441 11/09/20 1325 11/10/20 0500 11/10/20 0800  NA 135   < > 142 142  K 3.4*  --  4.1  --   CL 105  --  110  --   CO2 26  --  24  --   GLUCOSE 131*  --  109*  --   BUN 24*  --  27*  --   CREATININE 0.71  --  0.85  --   CALCIUM 7.0*  --  7.4*  --    < > = values in this interval not displayed.    Studies/Results: DG CHEST PORT 1 VIEW  Result Date: 11/08/2020 CLINICAL DATA:  Endotracheal tube placement. EXAM: PORTABLE CHEST 1 VIEW COMPARISON:  Nov 06, 2020. FINDINGS: The heart size and mediastinal contours are within normal limits. Endotracheal and feeding tubes are in good position. Right-sided PICC line is unchanged. No pneumothorax is noted. Bibasilar atelectasis or infiltrates are noted with probable small pleural effusions. The visualized skeletal structures are unremarkable. IMPRESSION: Endotracheal and feeding tube in grossly good position. Bibasilar atelectasis or infiltrates are noted with probable small pleural effusions. Electronically Signed   By: Lupita Raider M.D.   On: 11/08/2020 19:08    Assessment/Plan: 35 yo M s/p [facility] with TBI, respiratory failure -  transitioning to salt tabs, stopping 3%; continue Na+ checks - no need for AED prophylaxis at this point - supportive care  [name] 11/10/2020, 10:30 AM

## 2020-11-11 LAB — BASIC METABOLIC PANEL
Anion gap: 6 (ref 5–15)
BUN: 22 mg/dL — ABNORMAL HIGH (ref 6–20)
CO2: 27 mmol/L (ref 22–32)
Calcium: 8.1 mg/dL — ABNORMAL LOW (ref 8.9–10.3)
Chloride: 107 mmol/L (ref 98–111)
Creatinine, Ser: 0.76 mg/dL (ref 0.61–1.24)
GFR, Estimated: >60 mL/min (ref >60)
Glucose, Bld: 139 mg/dL — ABNORMAL HIGH (ref 70–99)
Potassium: 3.9 mmol/L (ref 3.5–5.1)
Sodium: 140 mmol/L (ref 135–145)

## 2020-11-11 LAB — CBC
HCT: 28.7 % — ABNORMAL LOW (ref 39.0–52.0)
Hemoglobin: 9.3 g/dL — ABNORMAL LOW (ref 13.0–17.0)
MCH: 31.1 pg (ref 26.0–34.0)
MCHC: 32.4 g/dL (ref 30.0–36.0)
MCV: 96 fL (ref 80.0–100.0)
Platelets: 208 10*3/uL (ref 150–400)
RBC: 2.99 MIL/uL — ABNORMAL LOW (ref 4.22–5.81)
RDW: 12.3 % (ref 11.5–15.5)
WBC: 12.7 10*3/uL — ABNORMAL HIGH (ref 4.0–10.5)
nRBC: 0 % (ref 0.0–0.2)

## 2020-11-11 LAB — TRIGLYCERIDES: Triglycerides: 209 mg/dL — ABNORMAL HIGH (ref <150)

## 2020-11-11 LAB — GLUCOSE, CAPILLARY
Glucose-Capillary: 131 mg/dL — ABNORMAL HIGH (ref 70–99)
Glucose-Capillary: 137 mg/dL — ABNORMAL HIGH (ref 70–99)
Glucose-Capillary: 150 mg/dL — ABNORMAL HIGH (ref 70–99)
Glucose-Capillary: 152 mg/dL — ABNORMAL HIGH (ref 70–99)
Glucose-Capillary: 152 mg/dL — ABNORMAL HIGH (ref 70–99)
Glucose-Capillary: 153 mg/dL — ABNORMAL HIGH (ref 70–99)

## 2020-11-11 LAB — CULTURE, RESPIRATORY W GRAM STAIN

## 2020-11-11 LAB — SODIUM
Sodium: 140 mmol/L (ref 135–145)
Sodium: 138 mmol/L (ref 135–145)
Sodium: 138 mmol/L (ref 135–145)

## 2020-11-11 MED ORDER — OXYCODONE HCL 5 MG/5ML PO SOLN
5.0000 mg | Freq: Four times a day (QID) | ORAL | Status: DC | PRN
  Administered 2020-11-11 – 2020-11-14 (×7): 5 mg
  Filled 2020-11-11 (×7): qty 5

## 2020-11-11 NOTE — Progress Notes (Signed)
Subjective: Patient reports awakens and follows commands  Objective: Vital signs in last 24 hours: Temp:  [98.8 F (37.1 C)-101.1 F (38.4 C)] 100.5 F (38.1 C) (05/28 0400) Pulse Rate:  [61-90] 67 (05/28 0700) Resp:  [13-21] 18 (05/28 0700) BP: (114-138)/(65-90) 124/78 (05/28 0600) SpO2:  [90 %-98 %] 91 % (05/28 0700) FiO2 (%):  [40 %] 40 % (05/28 0400) Weight:  [128.6 kg] 128.6 kg (05/28 0500)  Intake/Output from previous day: 05/27 0701 - 05/28 0700 In: 2666.4 [I.V.:1106.4; NG/GT:1560] Out: 2050 [Urine:2050] Intake/Output this shift: No intake/output data recorded.  Physical Exam: awakens and follows commands on sedation.  MAEW spontaneously and to command  Lab Results: Recent Labs    11/10/20 0500 11/11/20 0522  WBC 11.8* 12.7*  HGB 9.4* 9.3*  HCT 28.4* 28.7*  PLT 209 208   BMET Recent Labs    11/10/20 0500 11/10/20 0800 11/11/20 0409 11/11/20 0522  NA 142   < > 140 140  K 4.1  --   --  3.9  CL 110  --   --  107  CO2 24  --   --  27  GLUCOSE 109*  --   --  139*  BUN 27*  --   --  22*  CREATININE 0.85  --   --  0.76  CALCIUM 7.4*  --   --  8.1*   < > = values in this interval not displayed.    Studies/Results: VAS [place] TRANSCRANIAL DOPPLER  Result Date: 11/10/2020  Transcranial Doppler Patient Name:  FERNIE GRIMM  Date of Exam:   11/10/2020 Medical Rec #: [id number]     Accession #:    [id number] Date of Birth: 1985-06-24    Patient Gender: M Patient Age:   34Y Exam Location:  [facility] Procedure:      VAS [place] TRANSCRANIAL DOPPLER Referring Phys: [id number] AMY N COX --------------------------------------------------------------------------------  Indications: Subarachnoid hemorrhage. Performing Technologist: Ernestene Mention  Examination Guidelines: A complete evaluation includes B-mode imaging, spectral Doppler, color Doppler, and power Doppler as needed of all accessible portions of each vessel. Bilateral testing is considered an integral part of a complete  examination. Limited examinations for reoccurring indications may be performed as noted.  +----------+-------------+----------+-----------+-------+ RIGHT TCD Right VM (cm)Depth (cm)PulsatilityComment +----------+-------------+----------+-----------+-------+ MCA          132.00                 1.26            +----------+-------------+----------+-----------+-------+ ACA          -30.70                 0.95            +----------+-------------+----------+-----------+-------+ Term ICA     101.00                 0.90            +----------+-------------+----------+-----------+-------+ PCA           69.90                 1.03            +----------+-------------+----------+-----------+-------+ Opthalmic     71.60                  1.7            +----------+-------------+----------+-----------+-------+ ICA siphon    60.50  1.18            +----------+-------------+----------+-----------+-------+ Vertebral    -38.40                 0.94            +----------+-------------+----------+-----------+-------+ Distal ICA    52.90                 1.18            +----------+-------------+----------+-----------+-------+  +----------+------------+----------+-----------+-------+ LEFT TCD  Left VM (cm)Depth (cm)PulsatilityComment +----------+------------+----------+-----------+-------+ MCA          115.00                1.11            +----------+------------+----------+-----------+-------+ ACA          -35.80                1.06            +----------+------------+----------+-----------+-------+ Term ICA     97.20                 0.90            +----------+------------+----------+-----------+-------+ PCA          48.60                 0.81            +----------+------------+----------+-----------+-------+ Opthalmic    42.60                 1.38            +----------+------------+----------+-----------+-------+ ICA siphon    45.20                 1.06            +----------+------------+----------+-----------+-------+ Vertebral    -47.70                0.88            +----------+------------+----------+-----------+-------+ Distal ICA   51.20                 0.91            +----------+------------+----------+-----------+-------+  +------------+-------+-------+             VM cm/sComment +------------+-------+-------+ Prox Basilar-35.80         +------------+-------+-------+ Dist Basilar-83.60         +------------+-------+-------+ +----------------------+----+ Right Lindegaard Ratio2.50 +----------------------+----+ +---------------------+----+ Left Lindegaard Ratio2.25 +---------------------+----+    Preliminary     Assessment/Plan: Patient is improving.  Na 140 off 3 % NaCl.  Receiving salt tablets.  Wean to extubate per trauma service.  I answered family's questions.    LOS: 9 days    Dorian Heckle, MD 11/11/2020, 7:48 AM
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
## 2020-11-12 ENCOUNTER — Inpatient Hospital Stay (HOSPITAL_COMMUNITY): Payer: No Typology Code available for payment source

## 2020-11-12 LAB — GLUCOSE, CAPILLARY
Glucose-Capillary: 133 mg/dL — ABNORMAL HIGH (ref 70–99)
Glucose-Capillary: 140 mg/dL — ABNORMAL HIGH (ref 70–99)
Glucose-Capillary: 146 mg/dL — ABNORMAL HIGH (ref 70–99)
Glucose-Capillary: 133 mg/dL — ABNORMAL HIGH (ref 70–99)
Glucose-Capillary: 104 mg/dL — ABNORMAL HIGH (ref 70–99)
Glucose-Capillary: 122 mg/dL — ABNORMAL HIGH (ref 70–99)

## 2020-11-12 LAB — BASIC METABOLIC PANEL
Anion gap: 5 (ref 5–15)
BUN: 21 mg/dL — ABNORMAL HIGH (ref 6–20)
CO2: 27 mmol/L (ref 22–32)
Calcium: 7.8 mg/dL — ABNORMAL LOW (ref 8.9–10.3)
Chloride: 106 mmol/L (ref 98–111)
Creatinine, Ser: 0.79 mg/dL (ref 0.61–1.24)
GFR, Estimated: >60 mL/min (ref >60)
Glucose, Bld: 125 mg/dL — ABNORMAL HIGH (ref 70–99)
Potassium: 3.6 mmol/L (ref 3.5–5.1)
Sodium: 138 mmol/L (ref 135–145)

## 2020-11-12 LAB — CBC
HCT: 28 % — ABNORMAL LOW (ref 39.0–52.0)
Hemoglobin: 9 g/dL — ABNORMAL LOW (ref 13.0–17.0)
MCH: 31.3 pg (ref 26.0–34.0)
MCHC: 32.1 g/dL (ref 30.0–36.0)
MCV: 97.2 fL (ref 80.0–100.0)
Platelets: 249 10*3/uL (ref 150–400)
RBC: 2.88 MIL/uL — ABNORMAL LOW (ref 4.22–5.81)
RDW: 12.2 % (ref 11.5–15.5)
WBC: 14.2 10*3/uL — ABNORMAL HIGH (ref 4.0–10.5)
nRBC: 0.1 % (ref 0.0–0.2)

## 2020-11-12 LAB — SODIUM: Sodium: 136 mmol/L (ref 135–145)

## 2020-11-12 MED ORDER — ALBUTEROL SULFATE (2.5 MG/3ML) 0.083% IN NEBU
INHALATION_SOLUTION | RESPIRATORY_TRACT | Status: AC
  Administered 2020-11-12: 2.5 mg via RESPIRATORY_TRACT
  Filled 2020-11-12: qty 3

## 2020-11-12 MED ORDER — IPRATROPIUM-ALBUTEROL 0.5-2.5 (3) MG/3ML IN SOLN
3.0000 mL | RESPIRATORY_TRACT | Status: DC
  Administered 2020-11-12 – 2020-11-14 (×9): 3 mL via RESPIRATORY_TRACT
  Filled 2020-11-12 (×9): qty 3

## 2020-11-12 MED ORDER — ALBUTEROL SULFATE (2.5 MG/3ML) 0.083% IN NEBU
2.5000 mg | INHALATION_SOLUTION | RESPIRATORY_TRACT | Status: DC | PRN
  Administered 2020-11-12: 2.5 mg via RESPIRATORY_TRACT
  Filled 2020-11-12: qty 3
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
## 2020-11-13 ENCOUNTER — Inpatient Hospital Stay (HOSPITAL_COMMUNITY): Payer: No Typology Code available for payment source

## 2020-11-13 DIAGNOSIS — I609 Nontraumatic subarachnoid hemorrhage, unspecified: Secondary | ICD-10-CM

## 2020-11-13 LAB — CBC
HCT: 26.6 % — ABNORMAL LOW (ref 39.0–52.0)
Hemoglobin: 8.5 g/dL — ABNORMAL LOW (ref 13.0–17.0)
MCH: 30.9 pg (ref 26.0–34.0)
MCHC: 32 g/dL (ref 30.0–36.0)
MCV: 96.7 fL (ref 80.0–100.0)
Platelets: 240 10*3/uL (ref 150–400)
RBC: 2.75 MIL/uL — ABNORMAL LOW (ref 4.22–5.81)
RDW: 12.3 % (ref 11.5–15.5)
WBC: 12.6 10*3/uL — ABNORMAL HIGH (ref 4.0–10.5)
nRBC: 0 % (ref 0.0–0.2)

## 2020-11-13 LAB — BASIC METABOLIC PANEL
Anion gap: 7 (ref 5–15)
BUN: 22 mg/dL — ABNORMAL HIGH (ref 6–20)
CO2: 26 mmol/L (ref 22–32)
Calcium: 7.9 mg/dL — ABNORMAL LOW (ref 8.9–10.3)
Chloride: 103 mmol/L (ref 98–111)
Creatinine, Ser: 0.68 mg/dL (ref 0.61–1.24)
GFR, Estimated: >60 mL/min (ref >60)
Glucose, Bld: 127 mg/dL — ABNORMAL HIGH (ref 70–99)
Potassium: 3.6 mmol/L (ref 3.5–5.1)
Sodium: 136 mmol/L (ref 135–145)

## 2020-11-13 LAB — GLUCOSE, CAPILLARY
Glucose-Capillary: 111 mg/dL — ABNORMAL HIGH (ref 70–99)
Glucose-Capillary: 118 mg/dL — ABNORMAL HIGH (ref 70–99)
Glucose-Capillary: 121 mg/dL — ABNORMAL HIGH (ref 70–99)
Glucose-Capillary: 134 mg/dL — ABNORMAL HIGH (ref 70–99)
Glucose-Capillary: 146 mg/dL — ABNORMAL HIGH (ref 70–99)
Glucose-Capillary: 95 mg/dL (ref 70–99)

## 2020-11-13 NOTE — Progress Notes (Signed)
Wasted 25 mL of fentanyl from previous tubing with Candie Mile RN.

## 2020-11-13 NOTE — Progress Notes (Signed)
Transcranial Doppler  Date POD PCO2 HCT BP  MCA ACA PCA OPHT SIPH VERT Basilar  5/20 MR     Right  Left   60  71   -32  -29   *  16   31  24    36  82   *  *   *      5/23 RH     Right  Left   67  127   -58  -40   31  51   30  35   57  79   *  *   *      5/25 RH     Right  Left   59  65   -21  -27   37  36   26  34   60  45   *  -38   *  -56    5/27 [facility]      Right  Left   132  115   -30.7  -35.8   69.9  48.6   71.6  42.60   60.5  45.2   -38.4  -47.7   -83.6  *    5/30MS      Right  Left   66  48   -27  -24   27  24   30  22    37  48   -15  -21   -25           Right  Left                                            Right  Left                                        MCA = Middle Cerebral Artery OPHT = Opthalmic Artery BASILAR = Basilar Artery  ACA = Anterior Cerebral Artery SIPH = Carotid Siphon PCA = Posterior Cerebral Artery VERT = Verterbral Artery   Normal MCA = 62+\-12 ACA = 50+\-12 PCA = 42+\-23   *- unable to insonate 5/27- RT [place] = 2.50  LT [place] = 2.25 5/30- Right [place]=2.36, left [place]=1.45  11/13/2020 4:22 PM 6/30., MHA, RVT, RDCS, RDMS
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
## 2020-11-14 LAB — BASIC METABOLIC PANEL
Anion gap: 7 (ref 5–15)
BUN: 20 mg/dL (ref 6–20)
CO2: 25 mmol/L (ref 22–32)
Calcium: 7.7 mg/dL — ABNORMAL LOW (ref 8.9–10.3)
Chloride: 106 mmol/L (ref 98–111)
Creatinine, Ser: 0.67 mg/dL (ref 0.61–1.24)
GFR, Estimated: >60 mL/min (ref >60)
Glucose, Bld: 123 mg/dL — ABNORMAL HIGH (ref 70–99)
Potassium: 3.4 mmol/L — ABNORMAL LOW (ref 3.5–5.1)
Sodium: 138 mmol/L (ref 135–145)

## 2020-11-14 LAB — CBC
HCT: 26.6 % — ABNORMAL LOW (ref 39.0–52.0)
Hemoglobin: 8.5 g/dL — ABNORMAL LOW (ref 13.0–17.0)
MCH: 30.7 pg (ref 26.0–34.0)
MCHC: 32 g/dL (ref 30.0–36.0)
MCV: 96 fL (ref 80.0–100.0)
Platelets: 251 10*3/uL (ref 150–400)
RBC: 2.77 MIL/uL — ABNORMAL LOW (ref 4.22–5.81)
RDW: 12.2 % (ref 11.5–15.5)
WBC: 12.4 10*3/uL — ABNORMAL HIGH (ref 4.0–10.5)
nRBC: 0 % (ref 0.0–0.2)

## 2020-11-14 LAB — GLUCOSE, CAPILLARY
Glucose-Capillary: 108 mg/dL — ABNORMAL HIGH (ref 70–99)
Glucose-Capillary: 111 mg/dL — ABNORMAL HIGH (ref 70–99)
Glucose-Capillary: 113 mg/dL — ABNORMAL HIGH (ref 70–99)
Glucose-Capillary: 116 mg/dL — ABNORMAL HIGH (ref 70–99)
Glucose-Capillary: 138 mg/dL — ABNORMAL HIGH (ref 70–99)
Glucose-Capillary: 95 mg/dL (ref 70–99)

## 2020-11-14 LAB — TRIGLYCERIDES: Triglycerides: 149 mg/dL (ref <150)

## 2020-11-14 MED ORDER — MORPHINE SULFATE (PF) 2 MG/ML IV SOLN
2.0000 mg | INTRAVENOUS | Status: DC | PRN
  Administered 2020-11-14: 2 mg via INTRAVENOUS
  Administered 2020-11-15: 4 mg via INTRAVENOUS
  Filled 2020-11-14 (×2): qty 1
  Filled 2020-11-14: qty 2

## 2020-11-14 MED ORDER — OXYCODONE HCL 5 MG/5ML PO SOLN: 5.0000 mg | Freq: Four times a day (QID) | ORAL | Status: DC | PRN

## 2020-11-14 MED ORDER — METHOCARBAMOL 1000 MG/10ML IJ SOLN
1000.0000 mg | Freq: Three times a day (TID) | INTRAVENOUS | Status: DC
  Administered 2020-11-15 – 2020-11-16 (×4): 1000 mg via INTRAVENOUS
  Filled 2020-11-14 (×7): qty 10

## 2020-11-14 NOTE — Procedures (Signed)
Extubation Procedure Note  Patient Details:   Name: Antonio Higgins DOB: 30-Oct-1985 MRN: [id number]   Airway Documentation:    Vent end date: 11/14/20 Vent end time: 0755   Evaluation  O2 sats: stable throughout Complications: No apparent complications Patient did tolerate procedure well. Bilateral Breath Sounds: Diminished (coarse)   Patient extubated per MD order & placed on 4L [place]. Patient is able to speak & has a really strong cough. Patient had positive cuff leak prior to extubation. Patient currently in no distress. Will continue to monitor due to failed extubation previously.  Antonio Higgins, Antonio Higgins 11/14/2020, 8:02 AM

## 2020-11-14 NOTE — Progress Notes (Signed)
Patient extubated and fentanyl and precedex gtt d/c 0800 this AM. Patient AOx4 but states that he sees "a dead baby in the corner of the room" and that he "was home yesterday". Suspected ICU delirium. No other neuro changes. Will continue to monitor.
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
## 2020-11-14 NOTE — Progress Notes (Signed)
Upon AM shift assessment, patient agitated and restless. Attempting to pull out ETT with bilateral wrist restraints. RT and RN at bedside, Dr. [name] paged to come up. Orders to extubate patient and d/c sedation. Patient was extubated without complication. Patient now on 4L [place] SpO2 96%.

## 2020-11-14 NOTE — Progress Notes (Signed)
Wasted 200 cc fentanyl with Renold Don RN.

## 2020-11-14 NOTE — Progress Notes (Addendum)
Patient coughed up cortrak while working with PT. Cortrak removed immediately. Tube feed like emesis afterwards. VSS. Dr. [name] notified.   Patient to be NPO until SLP sees him per Dr. [name].
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
## 2020-11-15 ENCOUNTER — Inpatient Hospital Stay (HOSPITAL_COMMUNITY): Payer: No Typology Code available for payment source

## 2020-11-15 ENCOUNTER — Encounter (HOSPITAL_COMMUNITY): Payer: Self-pay | Admitting: Internal Medicine

## 2020-11-15 DIAGNOSIS — S06303A Unspecified focal traumatic brain injury with loss of consciousness of 1 hour to 5 hours 59 minutes, initial encounter: Secondary | ICD-10-CM

## 2020-11-15 DIAGNOSIS — R000 Tachycardia, unspecified: Secondary | ICD-10-CM

## 2020-11-15 DIAGNOSIS — F17200 Nicotine dependence, unspecified, uncomplicated: Secondary | ICD-10-CM

## 2020-11-15 DIAGNOSIS — D72829 Elevated white blood cell count, unspecified: Secondary | ICD-10-CM

## 2020-11-15 DIAGNOSIS — I609 Nontraumatic subarachnoid hemorrhage, unspecified: Secondary | ICD-10-CM

## 2020-11-15 DIAGNOSIS — R739 Hyperglycemia, unspecified: Secondary | ICD-10-CM

## 2020-11-15 LAB — GLUCOSE, CAPILLARY
Glucose-Capillary: 109 mg/dL — ABNORMAL HIGH (ref 70–99)
Glucose-Capillary: 113 mg/dL — ABNORMAL HIGH (ref 70–99)
Glucose-Capillary: 115 mg/dL — ABNORMAL HIGH (ref 70–99)
Glucose-Capillary: 121 mg/dL — ABNORMAL HIGH (ref 70–99)
Glucose-Capillary: 133 mg/dL — ABNORMAL HIGH (ref 70–99)
Glucose-Capillary: 137 mg/dL — ABNORMAL HIGH (ref 70–99)

## 2020-11-15 MED ORDER — OXYCODONE HCL 5 MG PO TABS: 5.0000 mg | ORAL_TABLET | Freq: Four times a day (QID) | ORAL | Status: DC | PRN

## 2020-11-15 MED ORDER — AMOXICILLIN-POT CLAVULANATE 875-125 MG PO TABS
1.0000 | ORAL_TABLET | Freq: Two times a day (BID) | ORAL | Status: AC
  Administered 2020-11-15 – 2020-11-16 (×3): 1 via ORAL
  Filled 2020-11-15 (×4): qty 1

## 2020-11-15 MED ORDER — ACETAMINOPHEN 500 MG PO TABS
1000.0000 mg | ORAL_TABLET | Freq: Four times a day (QID) | ORAL | Status: DC
  Administered 2020-11-15 – 2020-11-16 (×3): 1000 mg via ORAL
  Filled 2020-11-15 (×4): qty 2

## 2020-11-15 MED ORDER — SODIUM CHLORIDE 1 G PO TABS: 2.0000 g | ORAL_TABLET | Freq: Three times a day (TID) | ORAL | Status: DC

## 2020-11-15 MED ORDER — DOCUSATE SODIUM 50 MG/5ML PO LIQD
100.0000 mg | Freq: Two times a day (BID) | ORAL | Status: DC
  Filled 2020-11-15: qty 10

## 2020-11-15 NOTE — Progress Notes (Signed)
Nurse consult IV team for PIV placement. Patient had pulled out PICC line. Site assessed. No concerns noted on assessment. New PIV placed and wrapped with gauze. Patient has wrist restrains, and mitts on at this time. Tomasita Morrow, RN VAST

## 2020-11-15 NOTE — Progress Notes (Signed)
Date POD PCO2 HCT BP  MCA ACA PCA OPHT SIPH VERT Basilar  5/20 MR     Right  Left   60  71   -32  -29   *  16   31  24    36  82   *  *   *      5/23 RH     Right  Left   67  127   -58  -40   31  51   30  35   57  79   *  *   *      5/25 RH     Right  Left   59  65   -21  -27   37  36   26  34   60  45   *  -38   *  -56    5/27 Ms [facility]      Right  Left   132  115   -30.7  -35.8   69.9  48.6   71.6  42.60   60.5  45.2   -38.4  -47.7   -83.6  *    5/30 MS      Right  Left   66  48   -27  -24   27  24   30  22    37  48   -15  -21   -25      6/1 JP     Right  Left   66  50   -27  -30   38  38   32  40   48  *   -34  -35   -37           Right  Left                                        MCA = Middle Cerebral Artery OPHT = Opthalmic Artery BASILAR = Basilar Artery  ACA = Anterior Cerebral Artery SIPH = Carotid Siphon PCA = Posterior Cerebral Artery VERT = Verterbral Artery   Normal MCA = 62+\-12 ACA = 50+\-12 PCA = 42+\-23   6/30, BS, RDMS, RVT
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
## 2020-11-15 NOTE — Progress Notes (Addendum)
I initially asked for patient's robaxin, due at 1400, to be sent up at 1457 (through [facility] messaging system) because it was missing. I looked in the tube station and the med room multiple times. I asked again for the robaxin at 1616. I have not received it yet but I will give it as soon as I get it.    1726: still no med. I message pharmacy again. I will hold this dose, I don't want it to overlap the next one.
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
## 2020-11-15 NOTE — Progress Notes (Signed)
Pt fell to his knees this am while trying to stand up and use the bathroom. No injuries sustained. Pt educated, MD notified, and fall bundle completed.

## 2020-11-15 NOTE — Progress Notes (Signed)
Rehab Admissions Coordinator Note:  Patient was screened by Clois Dupes for appropriateness for an Inpatient Acute Rehab Consult per PT recs.   At this time, we are recommending Inpatient Rehab consult. I will place order per protocol.  Clois Dupes RN MSN 11/15/2020, 8:29 AM  I can be reached at [phone].

## 2020-11-15 NOTE — Progress Notes (Signed)
Modified Barium Swallow Progress Note  Patient Details  Name: Antonio Higgins MRN: [id number] Date of Birth: 1985/10/05  Today's Date: 11/15/2020  Modified Barium Swallow completed.  Full report located under Chart Review in the Imaging Section.  Brief recommendations include the following:  Clinical Impression  Pt presents with pharyngeal dysphagia characterized by reduced pharyngeal constriction and anterior laryngeal movement. Pt demonstrated penetration (PAS 5) and aspiration (PAS 6,7) with thin liquids via cup/straw and with penetration (PAS 3) with nectar thick liquids via straw. Aspiration resulted in immediate/delayed coughing which was ineffective in expelling the aspirate. No functional benefit was noted with postural modifications. Vallecular residue was adequately cleared with pt's independent use of secondary swallows or with use of a liquid wash. A regular texture diet with nectar thick liquids will be initiated at this time. SLP will follow for treatment.   Swallow Evaluation Recommendations       SLP Diet Recommendations: Regular solids;Nectar thick liquid   Liquid Administration via: Cup;No straw   Medication Administration: Whole meds with liquid   Supervision: Staff to assist with self feeding;Intermittent supervision to cue for compensatory strategies   Compensations: Slow rate;Small sips/bites   Postural Changes: Remain semi-upright after after feeds/meals (Comment);Seated upright at 90 degrees   Oral Care Recommendations: Oral care BID     Jamara Vary I. Vear Clock, MS, CCC-SLP Acute Rehabilitation Services Office number [phone] Pager [phone]   Scheryl Marten 11/15/2020,2:34 PM
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
## 2020-11-16 LAB — BASIC METABOLIC PANEL
Anion gap: 14 (ref 5–15)
BUN: 19 mg/dL (ref 6–20)
CO2: 24 mmol/L (ref 22–32)
Calcium: 9.5 mg/dL (ref 8.9–10.3)
Chloride: 98 mmol/L (ref 98–111)
Creatinine, Ser: 0.78 mg/dL (ref 0.61–1.24)
GFR, Estimated: >60 mL/min (ref >60)
Glucose, Bld: 99 mg/dL (ref 70–99)
Potassium: 3.2 mmol/L — ABNORMAL LOW (ref 3.5–5.1)
Sodium: 136 mmol/L (ref 135–145)

## 2020-11-16 LAB — CBC
HCT: 37.5 % — ABNORMAL LOW (ref 39.0–52.0)
Hemoglobin: 12.3 g/dL — ABNORMAL LOW (ref 13.0–17.0)
MCH: 30.5 pg (ref 26.0–34.0)
MCHC: 32.8 g/dL (ref 30.0–36.0)
MCV: 93.1 fL (ref 80.0–100.0)
Platelets: 478 10*3/uL — ABNORMAL HIGH (ref 150–400)
RBC: 4.03 MIL/uL — ABNORMAL LOW (ref 4.22–5.81)
RDW: 12.8 % (ref 11.5–15.5)
WBC: 19.8 10*3/uL — ABNORMAL HIGH (ref 4.0–10.5)
nRBC: 0 % (ref 0.0–0.2)

## 2020-11-16 LAB — GLUCOSE, CAPILLARY
Glucose-Capillary: 126 mg/dL — ABNORMAL HIGH (ref 70–99)
Glucose-Capillary: 121 mg/dL — ABNORMAL HIGH (ref 70–99)

## 2020-11-16 MED ORDER — ACETAMINOPHEN 325 MG PO TABS
650.0000 mg | ORAL_TABLET | Freq: Four times a day (QID) | ORAL | Status: DC
  Administered 2020-11-16 – 2020-11-17 (×3): 650 mg via ORAL
  Filled 2020-11-16 (×3): qty 2

## 2020-11-16 MED ORDER — METHOCARBAMOL 500 MG PO TABS
500.0000 mg | ORAL_TABLET | Freq: Three times a day (TID) | ORAL | Status: DC
  Administered 2020-11-16 – 2020-11-17 (×3): 500 mg via ORAL
  Filled 2020-11-16 (×3): qty 1

## 2020-11-16 MED ORDER — POTASSIUM CHLORIDE CRYS ER 20 MEQ PO TBCR
40.0000 meq | EXTENDED_RELEASE_TABLET | Freq: Once | ORAL | Status: DC
  Filled 2020-11-16: qty 2
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
## 2020-11-16 NOTE — Plan of Care (Signed)
  Problem: Safety: Goal: Non-violent Restraint(s) Outcome: Progressing   Problem: Education: Goal: Knowledge of the prescribed therapeutic regimen Outcome: Progressing Goal: Knowledge of disease or condition will improve Outcome: Progressing   Problem: Clinical Measurements: Goal: Neurologic status will improve Outcome: Progressing   Problem: Tissue Perfusion: Goal: Ability to maintain intracranial pressure will improve Outcome: Progressing   Problem: Respiratory: Goal: Will regain and/or maintain adequate ventilation Outcome: Progressing   Problem: Skin Integrity: Goal: Risk for impaired skin integrity will decrease Outcome: Progressing Goal: Demonstration of wound healing without infection will improve Outcome: Progressing   Problem: Psychosocial: Goal: Ability to verbalize positive feelings about self will improve Outcome: Progressing Goal: Ability to participate in self-care as condition permits will improve Outcome: Progressing Goal: Ability to identify appropriate support needs will improve Outcome: Progressing   Problem: Health Behavior/Discharge Planning: Goal: Ability to manage health-related needs will improve Outcome: Progressing   Problem: Nutritional: Goal: Risk of aspiration will decrease Outcome: Progressing Goal: Dietary intake will improve Outcome: Progressing   Problem: Communication: Goal: Ability to communicate needs accurately will improve Outcome: Progressing   Problem: Education: Goal: Knowledge of General Education information will improve Description: Including pain rating scale, medication(s)/side effects and non-pharmacologic comfort measures Outcome: Progressing   Problem: Health Behavior/Discharge Planning: Goal: Ability to manage health-related needs will improve Outcome: Progressing   Problem: Clinical Measurements: Goal: Ability to maintain clinical measurements within normal limits will improve Outcome: Progressing Goal:  Will remain free from infection Outcome: Progressing Goal: Diagnostic test results will improve Outcome: Progressing Goal: Respiratory complications will improve Outcome: Progressing Goal: Cardiovascular complication will be avoided Outcome: Progressing   Problem: Activity: Goal: Risk for activity intolerance will decrease Outcome: Progressing   Problem: Nutrition: Goal: Adequate nutrition will be maintained Outcome: Progressing   Problem: Coping: Goal: Level of anxiety will decrease Outcome: Progressing   Problem: Elimination: Goal: Will not experience complications related to bowel motility Outcome: Progressing Goal: Will not experience complications related to urinary retention Outcome: Progressing   Problem: Pain Managment: Goal: General experience of comfort will improve Outcome: Progressing   Problem: Safety: Goal: Ability to remain free from injury will improve Outcome: Progressing   Problem: Skin Integrity: Goal: Risk for impaired skin integrity will decrease Outcome: Progressing   Problem: Activity: Goal: Ability to tolerate increased activity will improve Outcome: Progressing   Problem: Respiratory: Goal: Ability to maintain a clear airway and adequate ventilation will improve Outcome: Progressing   Problem: Role Relationship: Goal: Method of communication will improve Outcome: Progressing
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
## 2020-11-16 NOTE — Plan of Care (Signed)
  Problem: Education: Goal: Knowledge of the prescribed therapeutic regimen Outcome: Progressing Goal: Knowledge of disease or condition will improve Outcome: Progressing   Problem: Clinical Measurements: Goal: Neurologic status will improve Outcome: Progressing   Problem: Tissue Perfusion: Goal: Ability to maintain intracranial pressure will improve Outcome: Progressing   Problem: Respiratory: Goal: Will regain and/or maintain adequate ventilation Outcome: Progressing   Problem: Skin Integrity: Goal: Risk for impaired skin integrity will decrease Outcome: Progressing Goal: Demonstration of wound healing without infection will improve Outcome: Progressing

## 2020-11-16 NOTE — Progress Notes (Signed)
IP rehab admissions - noted plans to transfer to [facility] today.  I have no inpatient rehab bed available today for patient.  Will follow along for progress and bed availability.  Call for questions.  [phone]

## 2020-11-17 ENCOUNTER — Other Ambulatory Visit: Payer: Self-pay

## 2020-11-17 ENCOUNTER — Inpatient Hospital Stay (HOSPITAL_COMMUNITY): Payer: No Typology Code available for payment source

## 2020-11-17 ENCOUNTER — Inpatient Hospital Stay (HOSPITAL_COMMUNITY)
Admission: RE | Admit: 2020-11-17 | Discharge: 2020-11-21 | DRG: 945 | Disposition: A | Discharge status: Home / Self Care | Payer: PRIVATE HEALTH INSURANCE | Source: Intra-hospital | Attending: Physical Medicine & Rehabilitation | Admitting: Physical Medicine & Rehabilitation

## 2020-11-17 ENCOUNTER — Encounter (HOSPITAL_COMMUNITY): Payer: Self-pay | Admitting: Physical Medicine & Rehabilitation

## 2020-11-17 DIAGNOSIS — D62 Acute posthemorrhagic anemia: Secondary | ICD-10-CM

## 2020-11-17 DIAGNOSIS — I609 Nontraumatic subarachnoid hemorrhage, unspecified: Secondary | ICD-10-CM

## 2020-11-17 DIAGNOSIS — I10 Essential (primary) hypertension: Secondary | ICD-10-CM | POA: Diagnosis present

## 2020-11-17 DIAGNOSIS — L97529 Non-pressure chronic ulcer of other part of left foot with unspecified severity: Secondary | ICD-10-CM | POA: Diagnosis present

## 2020-11-17 DIAGNOSIS — R739 Hyperglycemia, unspecified: Secondary | ICD-10-CM | POA: Diagnosis present

## 2020-11-17 DIAGNOSIS — T07XXXA Unspecified multiple injuries, initial encounter: Secondary | ICD-10-CM

## 2020-11-17 DIAGNOSIS — S069X9A Unspecified intracranial injury with loss of consciousness of unspecified duration, initial encounter: Secondary | ICD-10-CM | POA: Diagnosis present

## 2020-11-17 DIAGNOSIS — E876 Hypokalemia: Secondary | ICD-10-CM | POA: Diagnosis present

## 2020-11-17 DIAGNOSIS — F17210 Nicotine dependence, cigarettes, uncomplicated: Secondary | ICD-10-CM | POA: Diagnosis present

## 2020-11-17 DIAGNOSIS — S27321D Contusion of lung, unilateral, subsequent encounter: Secondary | ICD-10-CM

## 2020-11-17 DIAGNOSIS — S065X9D Traumatic subdural hemorrhage with loss of consciousness of unspecified duration, subsequent encounter: Secondary | ICD-10-CM

## 2020-11-17 DIAGNOSIS — S066X9D Traumatic subarachnoid hemorrhage with loss of consciousness of unspecified duration, subsequent encounter: Principal | ICD-10-CM

## 2020-11-17 DIAGNOSIS — S06303A Unspecified focal traumatic brain injury with loss of consciousness of 1 hour to 5 hours 59 minutes, initial encounter: Secondary | ICD-10-CM | POA: Diagnosis present

## 2020-11-17 DIAGNOSIS — S069X0S Unspecified intracranial injury without loss of consciousness, sequela: Secondary | ICD-10-CM

## 2020-11-17 DIAGNOSIS — R1312 Dysphagia, oropharyngeal phase: Secondary | ICD-10-CM | POA: Diagnosis present

## 2020-11-17 DIAGNOSIS — S069XAA Unspecified intracranial injury with loss of consciousness status unknown, initial encounter: Secondary | ICD-10-CM | POA: Diagnosis present

## 2020-11-17 LAB — CBC
HCT: 37.9 % — ABNORMAL LOW (ref 39.0–52.0)
Hemoglobin: 12.5 g/dL — ABNORMAL LOW (ref 13.0–17.0)
MCH: 30.3 pg (ref 26.0–34.0)
MCHC: 33 g/dL (ref 30.0–36.0)
MCV: 92 fL (ref 80.0–100.0)
Platelets: 514 10*3/uL — ABNORMAL HIGH (ref 150–400)
RBC: 4.12 MIL/uL — ABNORMAL LOW (ref 4.22–5.81)
RDW: 12.6 % (ref 11.5–15.5)
WBC: 16.9 10*3/uL — ABNORMAL HIGH (ref 4.0–10.5)
nRBC: 0 % (ref 0.0–0.2)

## 2020-11-17 MED ORDER — POLYETHYLENE GLYCOL 3350 17 G PO PACK
17.0000 g | PACK | Freq: Every day | ORAL | Status: DC | PRN
Start: 2020-11-17 — End: 2020-11-21

## 2020-11-17 MED ORDER — LORAZEPAM 2 MG/ML IJ SOLN: 2.0000 mg | INTRAMUSCULAR | Status: DC | PRN

## 2020-11-17 MED ORDER — PROCHLORPERAZINE 25 MG RE SUPP
12.5000 mg | Freq: Four times a day (QID) | RECTAL | Status: DC | PRN
Start: 2020-11-17 — End: 2020-11-21

## 2020-11-17 MED ORDER — POLYETHYLENE GLYCOL 3350 17 G PO PACK
17.0000 g | PACK | Freq: Every day | ORAL | Status: DC
  Filled 2020-11-17: qty 1

## 2020-11-17 MED ORDER — GUAIFENESIN-DM 100-10 MG/5ML PO SYRP: 5.0000 mL | ORAL_SOLUTION | Freq: Four times a day (QID) | ORAL | Status: DC | PRN

## 2020-11-17 MED ORDER — PROCHLORPERAZINE EDISYLATE 10 MG/2ML IJ SOLN: 5.0000 mg | Freq: Four times a day (QID) | INTRAMUSCULAR | Status: DC | PRN

## 2020-11-17 MED ORDER — TRAZODONE HCL 50 MG PO TABS: 25.0000 mg | ORAL_TABLET | Freq: Every evening | ORAL | Status: DC | PRN

## 2020-11-17 MED ORDER — PROCHLORPERAZINE MALEATE 5 MG PO TABS: 5.0000 mg | ORAL_TABLET | Freq: Four times a day (QID) | ORAL | Status: DC | PRN

## 2020-11-17 MED ORDER — METHOCARBAMOL 500 MG PO TABS
500.0000 mg | ORAL_TABLET | Freq: Three times a day (TID) | ORAL | Status: DC
  Administered 2020-11-17 – 2020-11-19 (×3): 500 mg via ORAL
  Filled 2020-11-17 (×6): qty 1

## 2020-11-17 MED ORDER — CHLORHEXIDINE GLUCONATE 0.12% ORAL RINSE (MEDLINE KIT)
15.0000 mL | Freq: Two times a day (BID) | OROMUCOSAL | Status: DC
Start: 2020-11-17 — End: 2020-11-21
  Administered 2020-11-20 – 2020-11-21 (×2): 15 mL via OROMUCOSAL

## 2020-11-17 MED ORDER — OXYCODONE HCL 5 MG PO TABS: 5.0000 mg | ORAL_TABLET | Freq: Four times a day (QID) | ORAL | Status: DC | PRN

## 2020-11-17 MED ORDER — FLEET ENEMA 7-19 GM/118ML RE ENEM
1.0000 | ENEMA | Freq: Once | RECTAL | Status: DC | PRN
Start: 2020-11-17 — End: 2020-11-21

## 2020-11-17 MED ORDER — ENOXAPARIN SODIUM 40 MG/0.4ML IJ SOSY
40.0000 mg | PREFILLED_SYRINGE | INTRAMUSCULAR | Status: DC
  Administered 2020-11-17 – 2020-11-18 (×2): 40 mg via SUBCUTANEOUS
  Filled 2020-11-17 (×2): qty 0.4

## 2020-11-17 MED ORDER — BISACODYL 10 MG RE SUPP: 10.0000 mg | Freq: Every day | RECTAL | Status: DC | PRN

## 2020-11-17 MED ORDER — ACETAMINOPHEN 325 MG PO TABS
325.0000 mg | ORAL_TABLET | ORAL | Status: DC | PRN
  Administered 2020-11-19: 650 mg via ORAL
  Filled 2020-11-17: qty 2

## 2020-11-17 MED ORDER — DIPHENHYDRAMINE HCL 12.5 MG/5ML PO ELIX: 12.5000 mg | ORAL_SOLUTION | Freq: Four times a day (QID) | ORAL | Status: DC | PRN

## 2020-11-17 MED ORDER — ALUM & MAG HYDROXIDE-SIMETH 200-200-20 MG/5ML PO SUSP
30.0000 mL | ORAL | Status: DC | PRN
Start: 2020-11-17 — End: 2020-11-21

## 2020-11-17 MED ORDER — DOCUSATE SODIUM 50 MG/5ML PO LIQD
100.0000 mg | Freq: Two times a day (BID) | ORAL | Status: DC
  Filled 2020-11-17 (×2): qty 10
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
## 2020-11-17 NOTE — Progress Notes (Signed)
Inpatient Rehabilitation Medication Review by a Pharmacist  A complete drug regimen review was completed for this patient to identify any potential clinically significant medication issues.  Clinically significant medication issues were identified:  no  Check AMION for pharmacist assigned to patient if future medication questions/issues arise during this admission.  Pharmacist comments: n/a  Time spent performing this drug regimen review (minutes):  10 minutes   Rosalyn Gess, PharmD 11/17/2020 5:25 PM
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
## 2020-11-17 NOTE — Progress Notes (Signed)
Inpatient Rehab Admissions Coordinator:   I have a bed for this patient to admit today.  Leary Roca, PA-C, in agreement.  Will let pt/family and TOC team know.   Estill Dooms, PT, DPT Admissions Coordinator [phone] 11/17/20  10:35 AM

## 2020-11-17 NOTE — Progress Notes (Signed)
Date POD PCO2 HCT BP  MCA ACA PCA OPHT SIPH VERT Basilar  5/20 MR     Right  Left   60  71   -32  -29   *  16   31  24    36  82   *  *   *      5/23 RH     Right  Left   67  127   -58  -40   31  51   30  35   57  79   *  *   *      5/25 RH     Right  Left   59  65   -21  -27   37  36   26  34   60  45   *  -38   *  -56    5/27 [facility]      Right  Left   132  115   -30.7  -35.8   69.9  48.6   71.6  42.60   60.5  45.2   -38.4  -47.7   -83.6  *    5/30 MS     Right  Left   66  48   -27  -24   27  24   30  22    37  48   -15  -21   -25      6/1 JP     Right  Left   66  50   -27  -30   38  38   32  40   48  *   -34  -35   -37      6/3 rs     Right  Left   61  48   -46  -49   34  35   30  23   40  45   -29  -27     -40     MCA = Middle Cerebral Artery OPHT = Opthalmic Artery BASILAR = Basilar Artery  ACA = Anterior Cerebral Artery SIPH = Carotid Siphon PCA = Posterior Cerebral Artery VERT = Verterbral Artery   Normal MCA = 62+\-12 ACA = 50+\-12 PCA = 42+\-23   6/30 Marcanthony Sleight, BS, RDMS, RVT
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
## 2020-11-17 NOTE — Progress Notes (Signed)
INPATIENT REHABILITATION ADMISSION NOTE   Arrival Method: Bed     Mental Orientation: alert and oriented x 4   Assessment: completed   Skin: abrasions   IV'S: left arm   Pain: no pain   Tubes and Drains: none   Safety Measures: fall risk   Vital Signs: completed   Height and Weight: completed   Rehab Orientation: done   Family:    Notes:INPATIENT REHABILITATION ADMISSION NOTE   Arrival Method:      Mental Orientation:   Assessment:   Skin:   IV'S:   Pain:   Tubes and Drains:   Safety Measures:   Vital Signs:   Height and Weight:   Rehab Orientation:   Family:    Notes:

## 2020-11-18 DIAGNOSIS — S06303A Unspecified focal traumatic brain injury with loss of consciousness of 1 hour to 5 hours 59 minutes, initial encounter: Secondary | ICD-10-CM

## 2020-11-18 LAB — COMPREHENSIVE METABOLIC PANEL
ALT: 114 U/L — ABNORMAL HIGH (ref 0–44)
AST: 33 U/L (ref 15–41)
Albumin: 3.4 g/dL — ABNORMAL LOW (ref 3.5–5.0)
Alkaline Phosphatase: 75 U/L (ref 38–126)
Anion gap: 9 (ref 5–15)
BUN: 17 mg/dL (ref 6–20)
CO2: 24 mmol/L (ref 22–32)
Calcium: 9.5 mg/dL (ref 8.9–10.3)
Chloride: 101 mmol/L (ref 98–111)
Creatinine, Ser: 0.84 mg/dL (ref 0.61–1.24)
GFR, Estimated: >60 mL/min (ref >60)
Glucose, Bld: 96 mg/dL (ref 70–99)
Potassium: 3.7 mmol/L (ref 3.5–5.1)
Sodium: 134 mmol/L — ABNORMAL LOW (ref 135–145)
Total Bilirubin: 0.9 mg/dL (ref 0.3–1.2)
Total Protein: 7.6 g/dL (ref 6.5–8.1)

## 2020-11-18 LAB — CBC WITH DIFFERENTIAL/PLATELET
Abs Immature Granulocytes: 0.11 10*3/uL — ABNORMAL HIGH (ref 0.00–0.07)
Basophils Absolute: 0.1 10*3/uL (ref 0.0–0.1)
Basophils Relative: 1 %
Eosinophils Absolute: 0.3 10*3/uL (ref 0.0–0.5)
Eosinophils Relative: 3 %
HCT: 36.3 % — ABNORMAL LOW (ref 39.0–52.0)
Hemoglobin: 12.1 g/dL — ABNORMAL LOW (ref 13.0–17.0)
Immature Granulocytes: 1 %
Lymphocytes Relative: 35 %
Lymphs Abs: 4.5 10*3/uL — ABNORMAL HIGH (ref 0.7–4.0)
MCH: 30.3 pg (ref 26.0–34.0)
MCHC: 33.3 g/dL (ref 30.0–36.0)
MCV: 90.8 fL (ref 80.0–100.0)
Monocytes Absolute: 0.9 10*3/uL (ref 0.1–1.0)
Monocytes Relative: 7 %
Neutro Abs: 6.7 10*3/uL (ref 1.7–7.7)
Neutrophils Relative %: 53 %
Platelets: 531 10*3/uL — ABNORMAL HIGH (ref 150–400)
RBC: 4 MIL/uL — ABNORMAL LOW (ref 4.22–5.81)
RDW: 12.4 % (ref 11.5–15.5)
WBC: 12.6 10*3/uL — ABNORMAL HIGH (ref 4.0–10.5)
nRBC: 0 % (ref 0.0–0.2)

## 2020-11-18 MED ORDER — DOCUSATE SODIUM 50 MG/5ML PO LIQD: 100.0000 mg | Freq: Two times a day (BID) | ORAL | Status: DC | PRN
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
## 2020-11-18 NOTE — Progress Notes (Signed)
Occupational Therapy Session Note  Patient Details  Name: Antonio Higgins MRN: [id number] Date of Birth: 05/10/1986  Today's Date: 11/18/2020 OT Individual Time: 1430-1455 OT Individual Time Calculation (min): 25 min    Short Term Goals: Week 1:  OT Short Term Goal 1 (Week 1): STG=LTG d/t ELOS  Skilled Therapeutic Interventions/Progress Updates:  Pt received in bed agreeable to OT after awakening. Pt agreeable to ambalation to outside courtyard with RW and MIN A for 2 lateral LOB to L during abrupt stopping and during turning. Pt able to identify balance is not 100% but feels like "I just need to walk." While seated on outside bench OT educates on alcohol use after TBI, not operating power tools/heavy machinery, waiting to drive till cleared by physician, no firearm use, and light/temp sensitivity. Pt verbalized understanding stating, "I cant do anything fun now hah" but will likely need more reinforcement. Pt returns to room in same manner as above able to recall path back to room. Exited session with pt seated in bed, exit alarm on and call light in reach    Therapy Documentation Precautions:  Precautions Precautions: Fall Precaution Comments: posey bed restraint Restrictions Weight Bearing Restrictions: No General:   Vital Signs: Therapy Vitals Temp: 98.1 F (36.7 C) Temp Source: Oral Pulse Rate: 97 Resp: 17 BP: (!) 125/91 Patient Position (if appropriate): Sitting Oxygen Therapy SpO2: 98 % O2 Device: Room Air Pain: Pain Assessment Pain Scale: 0-10 Pain Score: 0-No pain ADL: ADL Grooming: Contact guard Where Assessed-Grooming: Standing at sink Upper Body Bathing: Setup Where Assessed-Upper Body Bathing: Shower Lower Body Bathing: Contact guard Where Assessed-Lower Body Bathing: Shower Upper Body Dressing: Contact guard Where Assessed-Upper Body Dressing: Standing at sink Lower Body Dressing: Contact guard Where Assessed-Lower Body Dressing: Sitting at  sink,Standing at sink Toileting: Contact guard Toilet Transfer: Furniture conservator/restorer Method: Ambulating Tub/Shower Transfer: Conservation officer, historic buildings Transfer: Administrator, arts Method: Ambulating Vision   Perception    Praxis Praxis: Intact Exercises:   Other Treatments:     Therapy/Group: Individual Therapy  Shon Hale 11/18/2020, 3:01 PM
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
## 2020-11-18 NOTE — Plan of Care (Signed)
  Problem: RH Balance Goal: LTG Patient will maintain dynamic sitting balance (PT) Description: LTG:  Patient will maintain dynamic sitting balance with assistance during mobility activities (PT) Flowsheets (Taken 11/18/2020 1300) LTG: Pt will maintain dynamic sitting balance during mobility activities with:: Independent Goal: LTG Patient will maintain dynamic standing balance (PT) Description: LTG:  Patient will maintain dynamic standing balance with assistance during mobility activities (PT) Flowsheets (Taken 11/18/2020 1300) LTG: Pt will maintain dynamic standing balance during mobility activities with:: Independent with assistive device    Problem: RH Bed Mobility Goal: LTG Patient will perform bed mobility with assist (PT) Description: LTG: Patient will perform bed mobility with assistance, with/without cues (PT). Flowsheets (Taken 11/18/2020 1300) LTG: Pt will perform bed mobility with assistance level of: Independent   Problem: RH Bed to Chair Transfers Goal: LTG Patient will perform bed/chair transfers w/assist (PT) Description: LTG: Patient will perform bed to chair transfers with assistance (PT). Flowsheets (Taken 11/18/2020 1300) LTG: Pt will perform Bed to Chair Transfers with assistance level: Independent with assistive device    Problem: RH Floor Transfers Goal: LTG Patient will perform floor transfers w/assist (PT) Description: LTG: Patient will perform floor transfers with assistance (PT). Flowsheets (Taken 11/18/2020 1300) LTG: PT WILL PERFORM FLOOR TRANFERS  WITH  ASSIST:: Supervision/Verbal cueing   Problem: RH Ambulation Goal: LTG Patient will ambulate in controlled environment (PT) Description: LTG: Patient will ambulate in a controlled environment, # of feet with assistance (PT). Flowsheets (Taken 11/18/2020 1300) LTG: Pt will ambulate in controlled environ  assist needed:: Supervision/Verbal cueing LTG: Ambulation distance in controlled environment: 229ft without AD Goal:  LTG Patient will ambulate in home environment (PT) Description: LTG: Patient will ambulate in home environment, # of feet with assistance (PT). Flowsheets (Taken 11/18/2020 1300) LTG: Pt will ambulate in home environ  assist needed:: Supervision/Verbal cueing LTG: Ambulation distance in home environment: 25ft with LRAD Goal: LTG Patient will ambulate in community environment (PT) Description: LTG: Patient will ambulate in community environment, # of feet with assistance (PT). Flowsheets (Taken 11/18/2020 1300) LTG: Pt will ambulate in community environ  assist needed:: Supervision/Verbal cueing LTG: Ambulation distance in community environment: 347ft with LRAD   Problem: RH Stairs Goal: LTG Patient will ambulate up and down stairs w/assist (PT) Description: LTG: Patient will ambulate up and down # of stairs with assistance (PT) Flowsheets (Taken 11/18/2020 1300) LTG: Pt will ambulate up/down stairs assist needed:: Supervision/Verbal cueing LTG: Pt will  ambulate up and down number of stairs: 3 steps with UE support
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
## 2020-11-19 MED ORDER — METHOCARBAMOL 500 MG PO TABS: 500.0000 mg | ORAL_TABLET | Freq: Three times a day (TID) | ORAL | Status: DC | PRN

## 2020-11-19 NOTE — Progress Notes (Signed)
Physical Therapy Session Note  Patient Details  Name: Antonio Higgins MRN: [id number] Date of Birth: 10-06-1985  Today's Date: 11/19/2020 PT Individual Time: 1400-1510 PT Individual Time Calculation (min): 70 min   Short Term Goals: Week 1:  PT Short Term Goal 1 (Week 1): STG=LTG due to ELOS  Skilled Therapeutic Interventions/Progress Updates:    Pt received supine in bed, agreeable to PT session. No complaints of pain. Bed mobility independent. Sit to stand with Supervision and no AD throughout session. Ambulation up to 1000 ft with no AD and Supervision during session. Pt exhibits decreased safety awareness with cues needed for attention to environment and to tasks during therapy session. Squats 2 x 10 reps with close Supervision for balance with focus on LE strengthening. Alt L/R LE cone taps with CGA to min A needed for balance, pt able to follow cues of left/right and color of cone. Sidestep with cone tap with CGA for balance, 2 x 10 ft each direction. Ambulation through obstacle course navigating on/off airex, up/down 4" step, stepping over hurdles, and with tandem gait with close Supervision to CGA needed for balance. Ambulation outdoors across uneven ground and up/down inclines with close Supervision, and down 12 x 6" stairs with L handrail with CGA for safety and balance. Static standing balance performing ball toss while naming foods and then animals. Pt requires increased time to name items and also requires cues to attend to task due to being distracted at times by environment as well as internally distracted. 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 Pt understanding of education, will require ongoing reinforcement regarding his injury and for safety awareness. Pt returned to room at end of session, left supine in bed with needs in reach, bed alarm in place.    Therapy Documentation Precautions:  Precautions Precautions: Fall Precaution Comments: posey bed restraint Restrictions Weight Bearing Restrictions: No    Therapy/Group: Individual Therapy   Peter Congo, PT, DPT, CSRS  11/19/2020, 3:12 PM

## 2020-11-19 NOTE — Progress Notes (Signed)
Speech Language Pathology TBI Note  Patient Details  Name: Antonio Higgins MRN: [id number] Date of Birth: 1985-09-20  Today's Date: 11/19/2020 SLP Individual Time: 1300-1400 SLP Individual Time Calculation (min): 60 min  Short Term Goals: Week 1: SLP Short Term Goal 1 (Week 1): STG = LTS due to ELOS  Skilled Therapeutic Interventions: Pt seen for skilled ST with focus on cognitive goals. Pt seated EOB and agreeable to therapeutic tasks. Pt completing money management activity benefiting from min fading to supervision level verbal cues. SLP facilitating med management task with TID pill box 100% accurate with Supervision level cues. Pt currently only taking 3 medications, believes he can manage them without difficulty at d/c. Pt completing moderately complex calendar task with elements of planning, mathematics and divided attention, benefits from min A cues for accuracy. Pt left with all needs met and PT following for tx session. Cont ST POC.   Pain Pain Assessment Pain Scale: 0-10 Pain Score: 0-No pain  Agitated Behavior Scale: TBI Observation Details Observation Environment: pt room Start of observation period - Date: 11/19/20 Start of observation period - Time: 1300 End of observation period - Date: 11/19/20 End of observation period - Time: 1400 Agitated Behavior Scale (DO NOT LEAVE BLANKS) Short attention span, easy distractibility, inability to concentrate: Present to a slight degree Impulsive, impatient, low tolerance for pain or frustration: Absent Uncooperative, resistant to care, demanding: Absent Violent and/or threatening violence toward people or property: Absent Explosive and/or unpredictable anger: Absent Rocking, rubbing, moaning, or other self-stimulating behavior: Absent Pulling at tubes, restraints, etc.: Absent Wandering from treatment areas: Absent Restlessness, pacing, excessive movement: Absent Repetitive behaviors, motor, and/or verbal: Absent Rapid, loud, or  excessive talking: Absent Sudden changes of mood: Absent Easily initiated or excessive crying and/or laughter: Absent Self-abusiveness, physical and/or verbal: Absent Agitated behavior scale total score: 15  Therapy/Group: Individual Therapy  Dewaine Conger 11/19/2020, 2:29 PM

## 2020-11-19 NOTE — Progress Notes (Signed)
PROGRESS NOTE   Subjective/Complaints: No complaints Working with SLP  ROS: denies pain, +impaired balance   Objective:   No results found. Recent Labs    11/17/20 0211 11/18/20 0516  WBC 16.9* 12.6*  HGB 12.5* 12.1*  HCT 37.9* 36.3*  PLT 514* 531*   Recent Labs    11/18/20 0516  NA 134*  K 3.7  CL 101  CO2 24  GLUCOSE 96  BUN 17  CREATININE 0.84  CALCIUM 9.5    Intake/Output Summary (Last 24 hours) at 11/19/2020 1516 Last data filed at 11/18/2020 2100 Gross per 24 hour  Intake --  Output 250 ml  Net -250 ml        Physical Exam: Vital Signs Blood pressure 138/77, pulse 94, temperature 98.5 F (36.9 C), resp. rate 19, height 6\' 2"  (1.88 m), weight 119.5 kg, SpO2 99 %. Gen: no distress, normal appearing HEENT: oral mucosa pink and moist, NCAT Cardio: Reg rate Chest: normal effort, normal rate of breathing Abd: soft, non-distended Ext: no edema Psych: pleasant, normal affect Musculoskeletal:     Cervical back: Normal range of motion and neck supple.     Comments: No edema or tenderness in extremities  Skin:    General: Skin is warm and dry.     Comments: Scattered abrasions Ulcer on medial first digit of left lower extremity  Neurological:     Mental Status: He is alert.     Comments: Alert and oriented x2 Motor: Grossly 4+/5 throughout  Psychiatric:        Speech: Speech normal.        Cognition and Memory: Cognition is impaired.   Assessment/Plan: 1. Functional deficits which require 3+ hours per day of interdisciplinary therapy in a comprehensive inpatient rehab setting.  Physiatrist is providing close team supervision and 24 hour management of active medical problems listed below.  Physiatrist and rehab team continue to assess barriers to discharge/monitor patient progress toward functional and medical goals  Care Tool:  Bathing    Body parts bathed by patient: Right arm,Left  arm,Chest,Abdomen,Front perineal area,Buttocks,Right upper leg,Left upper leg,Right lower leg,Left lower leg,Face         Bathing assist Assist Level: Contact Guard/Touching assist     Upper Body Dressing/Undressing Upper body dressing   What is the patient wearing?: Pull over shirt    Upper body assist Assist Level: Contact Guard/Touching assist (stanidng)    Lower Body Dressing/Undressing Lower body dressing      What is the patient wearing?: Pants     Lower body assist Assist for lower body dressing: Contact Guard/Touching assist     Toileting Toileting    Toileting assist Assist for toileting: Contact Guard/Touching assist     Transfers Chair/bed transfer  Transfers assist     Chair/bed transfer assist level: Supervision/Verbal cueing     Locomotion Ambulation   Ambulation assist      Assist level: Supervision/Verbal cueing Assistive device: No Device Max distance: 1000'   Walk 10 feet activity   Assist     Assist level: Supervision/Verbal cueing Assistive device: No Device   Walk 50 feet activity   Assist    Assist level:  Supervision/Verbal cueing Assistive device: No Device    Walk 150 feet activity   Assist    Assist level: Supervision/Verbal cueing Assistive device: No Device    Walk 10 feet on uneven surface  activity   Assist     Assist level: Contact Guard/Touching assist     Wheelchair     Assist Will patient use wheelchair at discharge?: No   Wheelchair activity did not occur: N/A         Wheelchair 50 feet with 2 turns activity    Assist    Wheelchair 50 feet with 2 turns activity did not occur: N/A       Wheelchair 150 feet activity     Assist  Wheelchair 150 feet activity did not occur: N/A       Blood pressure 138/77, pulse 94, temperature 98.5 F (36.9 C), resp. rate 19, height 6\' 2"  (1.88 m), weight 119.5 kg, SpO2 99 %.  Medical Problem List and Plan: 1.  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             -patient may shower             -ELOS/Goals: 5-7 days/supervision            Continue CIR 2. Impaired mobility: d/c Lovenox since ambulating 150 feet             -antiplatelet therapy: N/A 3. Pain Management: tylenol qid. Denies pain: change Robaxin to tid PRN             --d/c Oxycodone given no pain and history of addiction, patient agreeable 4. Mood: LCSW to follow for evaluation and support.              -antipsychotic agents: N/a 5. Neuropsych: This patient is not capable of making decisions on his own behalf. 6. Skin/Wound Care: Routine pressure relief measures. 7. Fluids/Electrolytes/Nutrition: Monitor I/Os             electrolytes stable, monitor weekly             --offer supplements if intake poor.  8.  Hypokalemia: Supplemented 06/03             -- Continue regular diet--monitor for tolerance.             normalized, monitor weekly 9. Headaches: As needed medications             Consider Topamax if necessary 10. Leucocytosis: Monitor for fevers and other signs of infection.              --has completed treatment for PNA 06/02             WBC trending down, monitor weekly 11. Acute blood loss anemia: Resolving.              Hgb 12.1, monitor weekly.  12. Hyperglycemia: Likely due to stress/tube feeds.  13. HTN: better controlled, provided dietary education    LOS: 2 days A FACE TO FACE EVALUATION WAS PERFORMED  08/02 P Tahisha Hakim 11/19/2020, 3:16 PM

## 2020-11-20 DIAGNOSIS — E876 Hypokalemia: Secondary | ICD-10-CM

## 2020-11-20 DIAGNOSIS — I10 Essential (primary) hypertension: Secondary | ICD-10-CM

## 2020-11-20 LAB — CBC
HCT: 36.1 % — ABNORMAL LOW (ref 39.0–52.0)
Hemoglobin: 12.3 g/dL — ABNORMAL LOW (ref 13.0–17.0)
MCH: 30.6 pg (ref 26.0–34.0)
MCHC: 34.1 g/dL (ref 30.0–36.0)
MCV: 89.8 fL (ref 80.0–100.0)
Platelets: 567 10*3/uL — ABNORMAL HIGH (ref 150–400)
RBC: 4.02 MIL/uL — ABNORMAL LOW (ref 4.22–5.81)
RDW: 12.2 % (ref 11.5–15.5)
WBC: 8.6 10*3/uL (ref 4.0–10.5)
nRBC: 0 % (ref 0.0–0.2)

## 2020-11-20 LAB — BASIC METABOLIC PANEL
Anion gap: 10 (ref 5–15)
BUN: 13 mg/dL (ref 6–20)
CO2: 24 mmol/L (ref 22–32)
Calcium: 9.5 mg/dL (ref 8.9–10.3)
Chloride: 102 mmol/L (ref 98–111)
Creatinine, Ser: 0.9 mg/dL (ref 0.61–1.24)
GFR, Estimated: >60 mL/min (ref >60)
Glucose, Bld: 100 mg/dL — ABNORMAL HIGH (ref 70–99)
Potassium: 3.7 mmol/L (ref 3.5–5.1)
Sodium: 136 mmol/L (ref 135–145)

## 2020-11-20 NOTE — Progress Notes (Signed)
PROGRESS NOTE   Subjective/Complaints: Up at EOB with SLP. Anxious to get home. No problems over weekend. Denies pain. Sometimes has a hard time settling his mind down. Thinking of work, jobs he has to do.   ROS: Patient denies fever, rash, sore throat, blurred vision, nausea, vomiting, diarrhea, cough, shortness of breath or chest pain, joint or back pain, headache, or mood change.   Objective:   No results found. Recent Labs    11/18/20 0516 11/20/20 0642  WBC 12.6* 8.6  HGB 12.1* 12.3*  HCT 36.3* 36.1*  PLT 531* 567*   Recent Labs    11/18/20 0516 11/20/20 0642  NA 134* 136  K 3.7 3.7  CL 101 102  CO2 24 24  GLUCOSE 96 100*  BUN 17 13  CREATININE 0.84 0.90  CALCIUM 9.5 9.5    Intake/Output Summary (Last 24 hours) at 11/20/2020 1155 Last data filed at 11/20/2020 0903 Gross per 24 hour  Intake 236 ml  Output 200 ml  Net 36 ml        Physical Exam: Vital Signs Blood pressure 115/86, pulse 91, temperature 98.6 F (37 C), temperature source Oral, resp. rate 14, height 6\' 2"  (1.88 m), weight 119.5 kg, SpO2 98 %. Constitutional: No distress . Vital signs reviewed. HEENT: EOMI, oral membranes moist Neck: supple Cardiovascular: RRR without murmur. No JVD    Respiratory/Chest: CTA Bilaterally without wheezes or rales. Normal effort    GI/Abdomen: BS +, non-tender, non-distended Ext: no clubbing, cyanosis, or edema Psych: pleasant but a little impulsive.  Musculoskeletal:     Cervical back: Normal range of motion and neck supple.     Comments: No edema or tenderness in extremities  Skin:    General: Skin is warm and dry.     Comments: Scattered abrasions on arms, numerous tats  Ulcer on medial first digit of left lower extremity  Neurological:     Mental Status: He is alert.     Comments: Alert and oriented x3 Motor: Grossly 4+/5 throughout, good sitting balance Fair insight and awareness. Normal language  and speech Decreased concentration  Assessment/Plan: 1. Functional deficits which require 3+ hours per day of interdisciplinary therapy in a comprehensive inpatient rehab setting.  Physiatrist is providing close team supervision and 24 hour management of active medical problems listed below.  Physiatrist and rehab team continue to assess barriers to discharge/monitor patient progress toward functional and medical goals  Care Tool:  Bathing    Body parts bathed by patient: Right arm,Left arm,Chest,Abdomen,Front perineal area,Buttocks,Right upper leg,Left upper leg,Right lower leg,Left lower leg,Face         Bathing assist Assist Level: Contact Guard/Touching assist     Upper Body Dressing/Undressing Upper body dressing   What is the patient wearing?: Pull over shirt    Upper body assist Assist Level: Contact Guard/Touching assist (stanidng)    Lower Body Dressing/Undressing Lower body dressing      What is the patient wearing?: Pants     Lower body assist Assist for lower body dressing: Contact Guard/Touching assist     Toileting Toileting    Toileting assist Assist for toileting: Contact Guard/Touching assist  Transfers Chair/bed transfer  Transfers assist     Chair/bed transfer assist level: Supervision/Verbal cueing     Locomotion Ambulation   Ambulation assist      Assist level: Supervision/Verbal cueing Assistive device: No Device Max distance: 1000'   Walk 10 feet activity   Assist     Assist level: Supervision/Verbal cueing Assistive device: No Device   Walk 50 feet activity   Assist    Assist level: Supervision/Verbal cueing Assistive device: No Device    Walk 150 feet activity   Assist    Assist level: Supervision/Verbal cueing Assistive device: No Device    Walk 10 feet on uneven surface  activity   Assist     Assist level: Contact Guard/Touching assist     Wheelchair     Assist Will patient use  wheelchair at discharge?: No   Wheelchair activity did not occur: N/A         Wheelchair 50 feet with 2 turns activity    Assist    Wheelchair 50 feet with 2 turns activity did not occur: N/A       Wheelchair 150 feet activity     Assist  Wheelchair 150 feet activity did not occur: N/A       Blood pressure 115/86, pulse 91, temperature 98.6 F (37 C), temperature source Oral, resp. rate 14, height 6\' 2"  (1.88 m), weight 119.5 kg, SpO2 98 %.  Medical Problem List and Plan: 1.  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             -patient may shower             -ELOS/Goals: 5-7 days/supervision           -Continue CIR therapies including PT, OT, and SLP   -encouraged safety awareness, taking time 2. Impaired mobility: d/c Lovenox since ambulating 150 feet             -antiplatelet therapy: N/A 3. Pain Management: tylenol qid. Denies pain: change Robaxin to tid PRN             -controlled without meds 4. Mood: LCSW to follow for evaluation and support.              -antipsychotic agents: N/a 5. Neuropsych: This patient is not capable of making decisions on his own behalf. 6. Skin/Wound Care: Routine pressure relief measures. 7. Fluids/Electrolytes/Nutrition: Monitor I/Os             not a whole lot of appetite, encouraged po  -I personally reviewed all of the patient's labs today, and lab work is within normal limits..  8.  Hypokalemia: Supplemented 06/03             -- Continue regular diet--monitor for tolerance.             improved 9. Headaches: As needed medications---appear improved             Consider Topamax if necessary 10. Leucocytosis: Monitor for fevers and other signs of infection.              --wbc's down to 8k today 11. Acute blood loss anemia: Resolving.              Hgb 12.1, monitor weekly.  12. Hyperglycemia: Likely due to stress/tube feeds.  13. HTN: controlled  6/6    LOS: 3 days A FACE TO FACE EVALUATION WAS PERFORMED  08/03 T  Riley Kill 11/20/2020, 11:55 AM

## 2020-11-20 NOTE — Plan of Care (Signed)
Behavioral Plan   [place] Level: VII  Behavior to decrease/ eliminate:  -Getting up without assist -Impulsivity  Changes to environment:  -Offer to unplug phone -Promote proper sleep/wake cycles   Interventions: -Chair alarm, bed alarm   Recommendations for interactions with patient: -Timely response to call bell -Encourage urinal use   Attendees:  Jake Shark, OT  Feliberto Gottron, SLP

## 2020-11-20 NOTE — Progress Notes (Signed)
Occupational Therapy TBI Note  Patient Details  Name: Antonio Higgins MRN: [id number] Date of Birth: 1985/10/07  Today's Date: 11/20/2020 OT Individual Time: 0930-1030 OT Individual Time Calculation (min): 60 min    Short Term Goals: Week 1:  OT Short Term Goal 1 (Week 1): STG=LTG d/t ELOS  Skilled Therapeutic Interventions/Progress Updates:    Pt received supine with no c/o pain. Extensive time spent discussing TBI education, d/c planning, home routine, and return to work/driving. Pt demonstrated improved self awareness of deficits, however still masking some with humor. Pt very polite and cooperative throughout session. Pt required encouragement for shower. He completed item retrieval around room, ambulating without a device with supervision. He completed shower at supervision level overall, requiring min cueing for safety at times, including sitting when closing eyes for hair care and washing LB. He donned shirt and pants with supervision. Pt completed 150 ft of functional mobility to the therapy gym with supervision. He completed BITS to work on [employer], he completed 4 item sequence with 90% accuracy. Pt returned to his room and was left sitting EOB with all needs met. Bed alarm set.   Therapy Documentation Precautions:  Precautions Precautions: Fall Precaution Comments: posey bed restraint Restrictions Weight Bearing Restrictions: No Agitated Behavior Scale: TBI Observation Details Observation Environment: CIR Start of observation period - Date: 11/20/20 Start of observation period - Time: 0930 End of observation period - Date: 11/20/20 End of observation period - Time: 1030 Agitated Behavior Scale (DO NOT LEAVE BLANKS) Short attention span, easy distractibility, inability to concentrate: Present to a slight degree Impulsive, impatient, low tolerance for pain or frustration: Absent Uncooperative, resistant to care, demanding: Absent Violent and/or threatening violence toward  people or property: Absent Explosive and/or unpredictable anger: Absent Rocking, rubbing, moaning, or other self-stimulating behavior: Absent Pulling at tubes, restraints, etc.: Absent Wandering from treatment areas: Absent Restlessness, pacing, excessive movement: Absent Repetitive behaviors, motor, and/or verbal: Absent Rapid, loud, or excessive talking: Absent Sudden changes of mood: Absent Easily initiated or excessive crying and/or laughter: Absent Self-abusiveness, physical and/or verbal: Absent Agitated behavior scale total score: 15  Therapy/Group: Individual Therapy  Curtis Sites 11/20/2020, 12:05 PM

## 2020-11-20 NOTE — Progress Notes (Signed)
Patient ID: Antonio Higgins, male   DOB: 1985/07/27, 35 y.o.   MRN: [id number]  SW met with pt and family in room to inform on likelihood of him discharging tomorrow after therapies, and/or Wednesday. Pt family to be here tomorrow for family edu. SW informed no DME needed, only outpatient therapies. Prefers [facility] as closer towards home. Pt is aware SW to follow-up with his wife to discuss further.   SW left message for pt wife Antonio Higgins [phone]) to inform on above, and requested return phone call to confirm preferred outpatient location.   Antonio Higgins, MSW, [place] Office: [phone] Cell: [phone] Fax: [phone]

## 2020-11-20 NOTE — Progress Notes (Signed)
Speech Language Pathology TBI Note  Patient Details  Name: Antonio Higgins MRN: [id number] Date of Birth: Oct 24, 1985  Today's Date: 11/20/2020 SLP Individual Time: 0810-0905 SLP Individual Time Calculation (min): 55 min  Short Term Goals: Week 1: SLP Short Term Goal 1 (Week 1): STG = LTS due to ELOS  Skilled Therapeutic Interventions: Skilled treatment session focused on dysphagia and cognitive goals. SLP facilitated session by providing skilled observation with snack of regular textures with thin liquids. Patient able to independently recall strategies but is not utilizing them with meals as he reports he no longer has s/s of aspiration. None were observed during snack. Recommend patient to continue current diet. SLP also facilitated session with a mildly complex organization and problem solving task of generating a schedule based on time constraints, etc. Patient was mildly impulsive with task and required extra time and Min A verbal cues to self-monitor and correct errors. Patient left upright in bed with alarm on and all needs within reach. Continue with current plan of care.      Pain Pain Assessment Pain Scale: 0-10 Pain Score: 0-No pain  Agitated Behavior Scale: TBI Observation Details Observation Environment: patient's room Start of observation period - Date: 11/20/20 Start of observation period - Time: 0810 End of observation period - Date: 11/20/20 End of observation period - Time: 0905 Agitated Behavior Scale (DO NOT LEAVE BLANKS) Short attention span, easy distractibility, inability to concentrate: Present to a slight degree Impulsive, impatient, low tolerance for pain or frustration: Absent Uncooperative, resistant to care, demanding: Absent Violent and/or threatening violence toward people or property: Absent Explosive and/or unpredictable anger: Absent Rocking, rubbing, moaning, or other self-stimulating behavior: Absent Pulling at tubes, restraints, etc.:  Absent Wandering from treatment areas: Absent Restlessness, pacing, excessive movement: Absent Repetitive behaviors, motor, and/or verbal: Absent Rapid, loud, or excessive talking: Absent Sudden changes of mood: Absent Easily initiated or excessive crying and/or laughter: Absent Self-abusiveness, physical and/or verbal: Absent Agitated behavior scale total score: 15  Therapy/Group: Individual Therapy  Ellianne Gowen 11/20/2020, 11:28 AM

## 2020-11-20 NOTE — Progress Notes (Signed)
Physical Therapy TBI Note  Patient Details  Name: Antonio Higgins MRN: [id number] Date of Birth: 1986-01-29  Today's Date: 11/20/2020 PT Individual Time: 1303-1420 PT Individual Time Calculation (min): 77 min   Short Term Goals: Week 1:  PT Short Term Goal 1 (Week 1): STG=LTG due to ELOS  Skilled Therapeutic Interventions/Progress Updates:     Patient in bed with his grandmother and father present upon PT arrival. Patient alert and agreeable to PT session. Patient denied pain during session. Patient and family expressed that they are eager for the patient to d/c home. Focused session on functional mobility assessment and outcome measures to determine progress since admission and patient's safety and fall risk with mobility at this time.   Patient was verbose and required redirect for attention to tasks throughout session. Patient's family reports that patient is very social and talkative at baseline. Patient and family deny any observable cognitive changes from patient's baseline. Will continue to assess.   Therapeutic Activity: Bed Mobility: Patient performed rolling R/L to/from prone and supine to/from sit independently in ADL bed and supine to sit in a hospital bed.  Transfers: Patient performed sit to/from stand from hospital bed, mat table, standard chair, ADL recliner, and ADL bed independently without AD.  Patient performed a simulated elevated truck height car transfer with supervision using car fram for support to boost up. Provided cues for safe technique.  Gait Training:  Patient ambulated multiple trials of 100-250 feet throughout the unit during session without AD with supervision for safety. Ambulated with decreased gait speed, mild ataxic gait, mostly on turns, with poor spatial awareness on the R, brushing objects with his R shoulder x3 during session. Patient had 3 LOB to the R requiring use of a stepping or reaching strategy for patient to catch their balance without additional  assist. Provided verbal cues for visual scanning with emphasis to the R, reduced speed on turns for safety, and increased gait spee and arm swing for improved balance with gait.  Patient ambulated up/down a ramp, over 10 feet of mulch (unlevel surface), and up/down a curb to simulate community ambulation over unlevel surfaces with supervision without AD. He also ambulated to/from the [facility] entrance, outside over unlevel sidewalk, 2x3 low steps with 1 rail, and over sloped grass and mulch >1000 ft with supervision and CGA x1 with transition to grass due to mild LOB. Provided cues for safety awareness, attention to environment and change of surfaces, and educated patient on having someone with him when walking outside for safety, patient in agreement.  6 Min Walk Test:  Instructed patient to ambulate as quickly and as safely as possible for 6 minutes using LRAD. Patient was allowed to take standing rest breaks without stopping the test, but if the patient required a sitting rest break the clock would be stopped and the test would be over.  Results: 1002 feet (305.4 meters, Avg speed 0.85 m/s) without an AD with supervision for safety. Results indicate that the patient has reduced endurance with ambulation compared to age matched norms (Male 40-70 or less 572 meters).   Neuromuscular Re-ed: Patient performed the following balance assessments to determine current fall risk level with dynamic standing balance and dynamic gait: Patient demonstrates improved fall risk score of 56/56 on Berg Balance Scale (44/56 on 11/18/20).  (<36= high risk for falls, close to 100%; 37-45 significant >80%; 46-51 moderate >50%; 52-55 lower >25%) Patient demonstrates good safety awareness with lower fall risk score with present balance deficits as noted  by score of 22/30 on Functional Gait Assessment. Patient's scores lower due to reduced speed with mild increased in gait deviations with eyes closed and horizontal head turns, and use  of 1 rail with stairs for safety. (<19=increased fall risk with dynamic gait)  Provided patient and his family with handouts on TBI education and TBI education booklet for review. Discussed TBI symptoms and management/coping strategies, safety concerns, 24/7 supervision, and smoking cessation (patient eager to quit and family supportive). Discussed family education, set-up for tomorrow with patient's wife, grandmother, and father, CSW made aware.   Patient sitting EOB with his family in the room at end of session with breaks locked, bed alarm set, and all needs within reach.    Therapy Documentation Precautions:  Precautions Precautions: Fall Precaution Comments: posey bed restraint Restrictions Weight Bearing Restrictions: No Agitated Behavior Scale: TBI Observation Details Observation Environment: CIR[facility] entrance Start of observation period - Date: 11/20/20 Start of observation period - Time: 1303 End of observation period - Date: 11/20/20 End of observation period - Time: 1420 Agitated Behavior Scale (DO NOT LEAVE BLANKS) Short attention span, easy distractibility, inability to concentrate: Present to a slight degree Impulsive, impatient, low tolerance for pain or frustration: Absent Uncooperative, resistant to care, demanding: Absent Violent and/or threatening violence toward people or property: Absent Explosive and/or unpredictable anger: Absent Rocking, rubbing, moaning, or other self-stimulating behavior: Absent Pulling at tubes, restraints, etc.: Absent Wandering from treatment areas: Absent Restlessness, pacing, excessive movement: Absent Repetitive behaviors, motor, and/or verbal: Absent Rapid, loud, or excessive talking: Present to a slight degree Sudden changes of mood: Absent Easily initiated or excessive crying and/or laughter: Absent Self-abusiveness, physical and/or verbal: Absent Agitated behavior scale total score: 16     Therapy/Group: Individual  Therapy  Olie Dibert L Daniella Dewberry PT, DPT  11/20/2020, 6:19 AM

## 2020-11-20 NOTE — IPOC Note (Signed)
Overall Plan of Care [facility]) Patient Details Name: Secundino Ellithorpe MRN: [id number] DOB: 1985-06-22  Admitting Diagnosis: Closed head injury with petechial brain hemorrhage, with loss of consciousness of 1 hour to 5 hours 59 minutes, initial encounter [facility])  Hospital Problems: Principal Problem:   Closed head injury with petechial brain hemorrhage, with loss of consciousness of 1 hour to 5 hours 59 minutes, initial encounter (HCC) Active Problems:   TBI (traumatic brain injury) (HCC)     Functional Problem List: Nursing Behavior,Endurance,Medication Management,Motor,Pain,Perception,Safety  PT Balance,Behavior,Endurance,Motor,Safety  OT Balance,Cognition,Endurance,Pain,Safety  SLP Cognition,Safety  TR         Basic ADL's: OT Grooming,Bathing,Dressing,Toileting     Advanced  ADL's: OT       Transfers: PT Bed Mobility,Bed to Chair,Car,Furniture,Floor  OT Toilet,Tub/Shower     Locomotion: PT Ambulation,Wheelchair Mobility,Stairs     Additional Impairments: OT None  SLP Swallowing      TR      Anticipated Outcomes Item Anticipated Outcome  Self Feeding MOD I  Swallowing  Mod I regular/thin   Basic self-care  MOD I  Toileting  MOD I   Bathroom Transfers MOD I toilet; S shower  Bowel/Bladder  n/a  Transfers  Mod I with LRAD  Locomotion  Supervision with LRAD  Communication     Cognition  Supervision  Pain  < 3  Safety/Judgment  Supervision and no falls   Therapy Plan: PT Intensity: Minimum of 1-2 x/day ,45 to 90 minutes PT Frequency: 5 out of 7 days PT Duration Estimated Length of Stay: 3-5 days OT Intensity: Minimum of 1-2 x/day, 45 to 90 minutes OT Frequency: 5 out of 7 days OT Duration/Estimated Length of Stay: 5-7 SLP Intensity: Minumum of 1-2 x/day, 30 to 90 minutes SLP Frequency: 3 to 5 out of 7 days SLP Duration/Estimated Length of Stay: 5-7 days   Due to the current state of emergency, patients may not be receiving their 3-hours of  Medicare-mandated therapy.   Team Interventions: Nursing Interventions Patient/Family Education,Pain Management,Medication Management,Discharge Planning,Psychosocial Support  PT interventions Ambulation/gait training,Community Teaching laboratory technician re-education,Psychosocial support,Stair training,UE/LE Strength taining/ROM,Wheelchair propulsion/positioning,Balance/vestibular training,Discharge planning,Functional electrical stimulation,Pain management,Skin care/wound management,Therapeutic Activities,UE/LE Coordination activities,Cognitive remediation/compensation,Disease management/prevention,Functional mobility training,Splinting/orthotics,Patient/family education,Therapeutic Exercise,Visual/perceptual remediation/compensation  OT Interventions Balance/vestibular training,Discharge planning,Functional electrical stimulation,Pain management,Self Care/advanced ADL retraining,UE/LE Coordination activities,Therapeutic Activities,Visual/perceptual remediation/compensation,Therapeutic Exercise,Skin care/wound managment,Patient/family education,Functional mobility training,Disease mangement/prevention,Cognitive remediation/compensation,Community reintegration,DME/adaptive equipment instruction,Neuromuscular re-education,Psychosocial support,UE/LE Strength taining/ROM  SLP Interventions Cognitive remediation/compensation,Therapeutic Activities,Therapeutic Exercise,Functional tasks,Cueing hierarchy,Internal/external aids,Dysphagia/aspiration precaution training,Medication managment,Patient/family education  TR Interventions    SW/CM Interventions Discharge Planning,Psychosocial Support,Patient/Family Education   Barriers to Discharge MD  Medical stability  Nursing Decreased caregiver support,Home environment access/layout,Lack of/limited family support,Weight,Medication compliance,Behavior 2-3 steps to enter mobile home  PT Inaccessible home environment,Home  environment access/layout    OT Decreased caregiver support,Lack of/limited family support    SLP      SW       Team Discharge Planning: Destination: PT-Home ,OT- Home , SLP-Home Projected Follow-up: PT-Outpatient PT, OT-  Outpatient OT, SLP-24 hour supervision/assistance Projected Equipment Needs: PT-To be determined, OT-  , SLP-None recommended by SLP Equipment Details: PT-pt has RW, OT-pt has shower chair- need to make sure will accomodate weight limit Patient/family involved in discharge planning: PT- Patient,Family member/caregiver,  OT-Patient, SLP-Patient  MD ELOS: 5 days Medical Rehab Prognosis:  Excellent Assessment: The patient has been admitted for CIR therapies with the diagnosis of TBI with polytrauma. The team will be addressing functional mobility, strength, stamina, balance, safety, adaptive techniques and equipment, self-care, bowel  and bladder mgt, patient and caregiver education, NMR, behavior, cognition, community reentry. Goals have been set at mod I for mobility and self-care and mod I to supervision for cognition.   Due to the current state of emergency, patients may not be receiving their 3 hours per day of Medicare-mandated therapy.    Ranelle Oyster, MD, FAAPMR      See Team Conference Notes for weekly updates to the plan of care

## 2020-11-20 NOTE — Care Management (Signed)
Inpatient Rehabilitation Center Individual Statement of Services  Patient Name:  Antonio Higgins  Date:  11/20/2020  Welcome to the Inpatient Rehabilitation Center.  Our goal is to provide you with an individualized program based on your diagnosis and situation, designed to meet your specific needs.  With this comprehensive rehabilitation program, you will be expected to participate in at least 3 hours of rehabilitation therapies Monday-Friday, with modified therapy programming on the weekends.  Your rehabilitation program will include the following services:  Physical Therapy (PT), Occupational Therapy (OT), 24 hour per day rehabilitation nursing, Therapeutic Recreaction (TR), Psychology, Neuropsychology, Care Coordinator, Rehabilitation Medicine, Nutrition Services, Pharmacy Services and Other  Weekly team conferences will be held on Tuesdays to discuss your progress.  Your Inpatient Rehabilitation Care Coordinator will talk with you frequently to get your input and to update you on team discussions.  Team conferences with you and your family in attendance may also be held.  Expected length of stay: 5-7 days    Overall anticipated outcome: Supervision  Depending on your progress and recovery, your program may change. Your Inpatient Rehabilitation Care Coordinator will coordinate services and will keep you informed of any changes. Your Inpatient Rehabilitation Care Coordinator's name and contact numbers are listed  below.  The following services may also be recommended but are not provided by the Inpatient Rehabilitation Center:   Driving Evaluations  Home Health Rehabiltiation Services  Outpatient Rehabilitation Services  Vocational Rehabilitation   Arrangements will be made to provide these services after discharge if needed.  Arrangements include referral to agencies that provide these services.  Your insurance has been verified to be:  [place]  Your primary doctor is:  No  PCP  Pertinent information will be shared with your doctor and your insurance company.  Inpatient Rehabilitation Care Coordinator:  Susie Cassette [phone] or [phone]  Information discussed with and copy given to patient by: Gretchen Short, 11/20/2020, 10:53 AM

## 2020-11-20 NOTE — Progress Notes (Signed)
Patient information reviewed and entered into eRehab System by Becky Lizvet Chunn, PPS coordinator. Information including medical coding, function ability, and quality indicators will be reviewed and updated through discharge.   

## 2020-11-21 MED ORDER — ACETAMINOPHEN 325 MG PO TABS: 325.0000 mg | ORAL_TABLET | ORAL | Status: AC | PRN

## 2020-11-21 MED ORDER — POLYETHYLENE GLYCOL 3350 17 G PO PACK: 17.0000 g | PACK | Freq: Every day | ORAL | 0 refills | Status: DC
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
## 2020-11-21 NOTE — Progress Notes (Signed)
PROGRESS NOTE   Subjective/Complaints: Sitting at EOB again. Wife at bedside. Feeling well. Ready to go home!  ROS: Patient denies fever, rash, sore throat, blurred vision, nausea, vomiting, diarrhea, cough, shortness of breath or chest pain, joint or back pain, headache, or mood change.   Objective:   No results found. Recent Labs    11/20/20 0642  WBC 8.6  HGB 12.3*  HCT 36.1*  PLT 567*   Recent Labs    11/20/20 0642  NA 136  K 3.7  CL 102  CO2 24  GLUCOSE 100*  BUN 13  CREATININE 0.90  CALCIUM 9.5    Intake/Output Summary (Last 24 hours) at 11/21/2020 0959 Last data filed at 11/21/2020 0820 Gross per 24 hour  Intake 714 ml  Output --  Net 714 ml        Physical Exam: Vital Signs Blood pressure 109/64, pulse 89, temperature 98.6 F (37 C), temperature source Oral, resp. rate 18, height 6\' 2"  (1.88 m), weight 119.5 kg, SpO2 99 %. Constitutional: No distress . Vital signs reviewed. HEENT: EOMI, oral membranes moist Neck: supple Cardiovascular: RRR without murmur. No JVD    Respiratory/Chest: CTA Bilaterally without wheezes or rales. Normal effort    GI/Abdomen: BS +, non-tender, non-distended Ext: no clubbing, cyanosis, or edema Psych: pleasant and cooperative Musculoskeletal:     Full ROM, No pain with AROM or PROM in the neck, trunk, or extremities. Posture appropriate  Skin:    General: Skin is warm and dry.     Comments: Scattered abrasions on arms, numerous tats  Ulcer on medial first digit of left lower extremity  Neurological:     Mental Status: He is alert.     Comments: Alert and oriented x3 Motor: Grossly 4+/5 throughout, good sitting balance Good insight and awareness. Normal language and speech Attention seems better  Assessment/Plan: 1. Functional deficits which require 3+ hours per day of interdisciplinary therapy in a comprehensive inpatient rehab setting.  Physiatrist is  providing close team supervision and 24 hour management of active medical problems listed below.  Physiatrist and rehab team continue to assess barriers to discharge/monitor patient progress toward functional and medical goals  Care Tool:  Bathing    Body parts bathed by patient: Right arm,Left arm,Chest,Abdomen,Front perineal area,Buttocks,Right upper leg,Left upper leg,Right lower leg,Left lower leg,Face         Bathing assist Assist Level: Supervision/Verbal cueing     Upper Body Dressing/Undressing Upper body dressing   What is the patient wearing?: Pull over shirt    Upper body assist Assist Level: Supervision/Verbal cueing    Lower Body Dressing/Undressing Lower body dressing      What is the patient wearing?: Pants     Lower body assist Assist for lower body dressing: Supervision/Verbal cueing     Toileting Toileting    Toileting assist Assist for toileting: Supervision/Verbal cueing     Transfers Chair/bed transfer  Transfers assist     Chair/bed transfer assist level: Supervision/Verbal cueing     Locomotion Ambulation   Ambulation assist      Assist level: Supervision/Verbal cueing Assistive device: No Device Max distance: >1000'   Walk 10 feet activity  Assist     Assist level: Supervision/Verbal cueing Assistive device: No Device   Walk 50 feet activity   Assist    Assist level: Supervision/Verbal cueing Assistive device: No Device    Walk 150 feet activity   Assist    Assist level: Supervision/Verbal cueing Assistive device: No Device    Walk 10 feet on uneven surface  activity   Assist     Assist level: Supervision/Verbal cueing     Wheelchair     Assist Will patient use wheelchair at discharge?: No   Wheelchair activity did not occur: N/A         Wheelchair 50 feet with 2 turns activity    Assist    Wheelchair 50 feet with 2 turns activity did not occur: N/A       Wheelchair 150  feet activity     Assist  Wheelchair 150 feet activity did not occur: N/A       Blood pressure 109/64, pulse 89, temperature 98.6 F (37 C), temperature source Oral, resp. rate 18, height 6\' 2"  (1.88 m), weight 119.5 kg, SpO2 99 %.  Medical Problem List and Plan: 1.  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             -dc home today  -outpt f/u  -see me in office in 4 weeks  -no ETOH. Discussed outpt f/u 2. Impaired mobility: d/c Lovenox since ambulating 150 feet             -antiplatelet therapy: N/A 3. Pain Management: tylenol qid. Denies pain: change Robaxin to tid PRN             -controlled without meds 4. Mood: LCSW to follow for evaluation and support.              -antipsychotic agents: N/a 5. Neuropsych: This patient is not capable of making decisions on his own behalf. 6. Skin/Wound Care: Routine pressure relief measures. 7. Fluids/Electrolytes/Nutrition: encourage appropriate po.  8.  Hypokalemia: Supplemented 06/03             -- Continue regular diet--monitor for tolerance.             improved 9. Headaches: As needed medications---improved             Consider Topamax if necessary 10. Leucocytosis: Monitor for fevers and other signs of infection.              --wbc's down to 8k today 11. Acute blood loss anemia: Resolving.              Hgb 12.1   12. Hyperglycemia: Likely due to stress/tube feeds.  13. HTN: controlled 6/6    LOS: 4 days A FACE TO FACE EVALUATION WAS PERFORMED  08/03 11/21/2020, 9:59 AM

## 2020-11-21 NOTE — Discharge Instructions (Signed)
Inpatient Rehab Discharge Instructions  Antonio Higgins Discharge date and time:  11/21/20  Activities/Precautions/ Functional Status: Activity: no lifting, driving, or strenuous exercise  till cleared by MD Diet: regular diet Wound Care: none needed    Functional status:  ___ No restrictions     ___ Walk up steps independently _X__ 24/7 supervision/assistance   ___ Walk up steps with assistance ___ Intermittent supervision/assistance  ___ Bathe/dress independently ___ Walk with walker     _X__ Bathe/dress with assistance ___ Walk Independently    ___ Shower independently ___ Walk with assistance    ___ Shower with assistance _X__ No alcohol     ___ Return to work/school ________  Special Instructions:    My questions have been answered and I understand these instructions. I will adhere to these goals and the provided educational materials after my discharge from the hospital.  Patient/Caregiver Signature _______________________________ Date __________  Clinician Signature _______________________________________ Date __________  Please bring this form and your medication list with you to all your follow-up doctor's appointments.
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
## 2020-11-21 NOTE — Progress Notes (Signed)
Physical Therapy TBI Note  Patient Details  Name: Antonio Higgins MRN: [id number] Date of Birth: 26-Mar-1986  Today's Date: 11/21/2020 PT Individual Time: 1100-1145 PT Individual Time Calculation (min): 45 min   Short Term Goals: Week 1:  PT Short Term Goal 1 (Week 1): STG=LTG due to ELOS  Skilled Therapeutic Interventions/Progress Updates:     Session 1: Patient sitting EOB with his wife, grandmother, and father in the room upon PT arrival. Patient alert and agreeable to PT session. Patient denied pain during session.  Focused session on family education in preparation for d/c. Educated on d/c process, following therapies, present balance deficits, and deficits in attention and safety awareness, return to work (recommended clearance from MD and focus on work conditioning in [employer]), smoking cessation, abstaining from alcohol and drug use, removal of weapons from the home and not operating heavy machinery or tools until cleared (educated that MD and OP therapies to assist with driving clearance), discussed TBI behaviors of agitation, emotional lability, fatigue, confusion/disorientation with exacerbation with over stimulation, exertion, and fatigue. Educated on signs/symptoms to alert MD to or call emergency services such as increased drowsiness, significant changes in personality, AMS, weakness, falls, changes in swallowing/speach, vision changes, or syncope.    Patient's wife participated in hands on training and provided safe guarding with all mobility throughout session.  Therapeutic Activity: Transfers: Patient performed sit to/from stand independently from various surfaces throughout session. Patient performed a simulated elevated truck height car transfer with supervision using car frame to boost up. Provided cues for safe technique. PT demonstrated a floor transfer from sitting<>lying. Educated on signs and symptoms to assess during self-assessment in the event of a fall and symptoms that  indicate immediate medical attention. Instructed patient to have his phone in his pocket at all times to have access to call emergency services and not to get up if any sign of critical injury, symptoms, or LOC. Then instructed patient if no signs or symptoms present, he should call for assistance from another person, crawl to the nearest stable place to sit and perform a transfer from the floor to the seat, not to standing for safety to reduce risk of a second fall. Patient then performed a floor transfer from mat table<>mat on the floor with supervision demonstrating good technique following PT instruction.   Gait Training:  Patient ambulated around the unit with supervision for safety throughout session. Ambulated with decreased gait speed, decreased visual scanning and spatial awareness, mild variable foot placement, and decreased R step height. Provided verbal cues for visual scanning, guarding patient on R, cued to path fining throughout the unit, patient 100% to locate all gyms and his room without assist.  Patient ascended/descended 12 steps using L rial to simulate home set-up with supervision. Performed reciprocal gait pattern. Provided cues for safe guarding technique technique.   Neuromuscular Re-ed: Patient performed 1 set of the following dynamic gait activities as part of HEP (handout provided): Access Code: [email or web address] Tandem Walking with Counter Support - 2 x daily - 7 x weekly - 1 sets - 5 reps Backward Tandem Walking with Counter Support - 2 x daily - 7 x weekly - 1 sets - 5 reps Forward and Backward Walking with Eyes Closed and Counter Support - 2 x daily - 7 x weekly - 1 sets - 5 reps Walking Figure Eight - 2 x daily - 7 x weekly - 1 sets - 5 reps  Patient Education Walking program: starting 5 min 3x per day progressing by  2 min every 2 days up to 25-30 min 2x per day  Patient sitting EOB with his family in the room at end of session with breaks locked, and all needs within reach.  Patient and family reported that all questions and concerns were answered and eager to take the patient home. RN and PA made aware that patient and his family had completed family education and therapies and is prepared for d/c.   Therapy Documentation Precautions:  Precautions Precautions: None Precaution Comments: posey bed restraint Restrictions Weight Bearing Restrictions: No Agitated Behavior Scale: TBI Observation Details Observation Environment: CIR Start of observation period - Date: 11/21/20 Start of observation period - Time: 1100 End of observation period - Date: 11/21/20 End of observation period - Time: 1145 Agitated Behavior Scale (DO NOT LEAVE BLANKS) Short attention span, easy distractibility, inability to concentrate: Present to a slight degree Impulsive, impatient, low tolerance for pain or frustration: Absent Uncooperative, resistant to care, demanding: Absent Violent and/or threatening violence toward people or property: Absent Explosive and/or unpredictable anger: Absent Rocking, rubbing, moaning, or other self-stimulating behavior: Absent Pulling at tubes, restraints, etc.: Absent Wandering from treatment areas: Absent Restlessness, pacing, excessive movement: Absent Repetitive behaviors, motor, and/or verbal: Absent Rapid, loud, or excessive talking: Absent Sudden changes of mood: Absent Easily initiated or excessive crying and/or laughter: Absent Self-abusiveness, physical and/or verbal: Absent Agitated behavior scale total score: 15   Therapy/Group: Individual Therapy  Zeppelin Beckstrand L Kc Sedlak PT, DPT  11/21/2020, 11:55 AM

## 2020-11-21 NOTE — Progress Notes (Signed)
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 Tobacco use    Past Surgical History: History reviewed. No pertinent surgical history. Social History:  reports that he has been smoking cigarettes. He has never used smokeless tobacco. He reports previous alcohol use. He reports previous drug use.  Family / Support Systems Marital Status: Married How Long?: 9 yeatrs Patient Roles: Spouse,Parent Spouse/Significant Other: Antonio Higgins [phone]) Children: 1 61 yr old son from a previous relationship Other Supports: Various friends/family Anticipated Caregiver: various family members Ability/Limitations of Caregiver: Wife and other family members work, however, patient lives in close proximity of relatives that are not working and able to provide supervision. Caregiver Availability: 24/7 Family Dynamics: Patient lives with wife.  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 Education: high school Read: Yes Write: Yes Employment Status: Employed Length of Employment:  (4 months) Return to Work Plans: TBD Public relations account executive Issues: Denies Guardian/Conservator: N/A   Abuse/Neglect Abuse/Neglect Assessment Can Be Completed: Yes Physical Abuse: Denies Verbal Abuse: Denies Sexual Abuse: Denies Exploitation of patient/patient's resources: Denies Self-Neglect: Denies  Emotional Status Pt's affect, behavior and adjustment status: Pt in good spirits at time of visit. Pt eager to  discharge home. Recent Psychosocial Issues: Denies Psychiatric History: Denies Substance Abuse History: Reports he quit smoking cigarettes at time of admission. has been smoking for about 16 yrs. 1ppd. States he is likely to not return smoking.  Patient / Family Perceptions, Expectations & Goals Pt/Family understanding of illness & functional limitations: Pt and family have a general understanding of patient care needs Premorbid pt/family roles/activities: Independent with all care needs; home care duties Anticipated changes in roles/activities/participation: Some assistance with IADLs such as bill pay Pt/family expectations/goals: Patietn goal is "to get out of here and walk somewhere...outside of this room."  [employer]: None Premorbid Home Care/DME Agencies: None Transportation available at discharge: Family  Discharge Planning Living Arrangements: Spouse/significant other [name]: Spouse/significant other,Children,Other relatives,Parent Type of Residence: Private residence Administrator, sports: Multimedia programmer (specify) Systems analyst) Financial Resources: [place] Referred: No Living Expenses: Medical laboratory scientific officer Management: Patient,Spouse Does the patient have any problems obtaining your medications?: No Home Management: Both split home care duties Patient/Family Preliminary Plans: TBD Care Coordinator Anticipated Follow Up Needs: HH/OP Expected length of stay: 5-7 days  Clinical Impression SW met with pt, pt father and grandmother in room to introduce self, explain role, and discuss discharge process. Pt is not a English as a second language teacher. NO HCPOA. No DME.   Antonio Higgins A Antonio Higgins 11/21/2020, 8:21 AM
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
## 2020-11-21 NOTE — Progress Notes (Signed)
Occupational Therapy Discharge Summary  Patient Details  Name: Antonio Higgins MRN: [id number] Date of Birth: 25-Dec-1985  Today's Date: 11/21/2020 OT Individual Time: 0905-1000 OT Individual Time Calculation (min): 55 min    Patient has met 8 of 8 long term goals due to improved activity tolerance, improved balance, postural control, ability to compensate for deficits, improved attention, improved awareness and improved coordination.  Patient to discharge at overall Modified Independent level.  Patient's care partner is independent to provide the necessary cognitive assistance at discharge. Pt's wife has completed family education and has demonstrated competency with providing supervision- mod I.   Reasons goals not met: All treatment goals met.   Recommendation:  No required follow up.   Equipment: No equipment provided  Reasons for discharge: treatment goals met and discharge from hospital  Patient/family agrees with progress made and goals achieved: Yes   Skilled OT Intervention: Family education session completed with pt's wife [place]. Provided handout on sexuality and relationships, also provided verbal education re personality change, hiding weapons in the home, driving restriction, and balance deficits. Pt completed 200 ft of functional mobility to the therapy gym with mod I overall. He completed BITS activity with focus on visual scanning and reaction time for carryover to driving. Pt did not pass first trial with a reaction time of 1.25, second trial he did pass with a 1.15 reaction time. Discussed implications of this.  Pt then completed dynamic standing balance activity with supervision-mod I with isolated single leg stance standing balance activity. Next he completed activity focused on functional activity tolerance and global strengthening, completed blocked practice 10x squats with overhead press. Discussed HEP and progression of activity. Pt returned to EOB and was left EOB with  all needs met.   OT Discharge Precautions/Restrictions  Precautions Precautions: None Restrictions Weight Bearing Restrictions: No Pain Pain Assessment Pain Scale: 0-10 Pain Score: 0-No pain ADL ADL Eating: Independent Where Assessed-Eating: Edge of bed Grooming: Modified independent Where Assessed-Grooming: Standing at sink Upper Body Bathing: Modified independent Where Assessed-Upper Body Bathing: Shower Lower Body Bathing: Modified independent Where Assessed-Lower Body Bathing: Shower Upper Body Dressing: Modified independent (Device) Where Assessed-Upper Body Dressing: Edge of bed Lower Body Dressing: Modified independent Where Assessed-Lower Body Dressing: Edge of bed Toileting: Modified independent Where Assessed-Toileting: Glass blower/designer: Diplomatic Services operational officer Method: Human resources officer: Distant supervision Tub/Shower Transfer Method: Magazine features editor: Distant supervision Social research officer, government Method: Ambulating Vision Baseline Vision/History: Wears glasses Wears Glasses: Distance only Patient Visual Report: No change from baseline Vision Assessment?: No apparent visual deficits Perception  Perception: Within Functional Limits Praxis Praxis: Intact Cognition Overall Cognitive Status: Impaired/Different from baseline Arousal/Alertness: Awake/alert Orientation Level: Oriented X4 Attention: Selective Selective Attention: Appears intact Memory: Impaired Memory Impairment: Decreased short term memory Decreased Short Term Memory: Functional complex Awareness Impairment: Anticipatory impairment Safety/Judgment: Appears intact [place] [employer] Scales of Cognitive Functioning: Purposeful/appropriate Sensation Sensation Light Touch: Appears Intact Proprioception: Appears Intact Coordination Gross Motor Movements are Fluid and Coordinated: Yes Fine Motor Movements are Fluid and Coordinated: Yes Heel Shin  Test: [facility] Motor  Motor Motor: Within Functional Limits Mobility  Bed Mobility Bed Mobility: Rolling Right;Rolling Left;Sit to Supine;Supine to Sit Rolling Right: Independent Rolling Left: Independent Supine to Sit: Independent Sit to Supine: Independent Transfers Sit to Stand: Independent Stand to Sit: Independent  Trunk/Postural Assessment  Cervical Assessment Cervical Assessment: Within Functional Limits Thoracic Assessment Thoracic Assessment: Within Functional Limits Lumbar Assessment Lumbar Assessment: Within Functional Limits Postural Control Postural Control: Deficits  on evaluation (somewhat delayed)  Balance Balance Balance Assessed: Yes Standardized Balance Assessment Standardized Balance Assessment: Berg Balance Test;Functional Gait Assessment Berg Balance Test Sit to Stand: Able to stand without using hands and stabilize independently Standing Unsupported: Able to stand safely 2 minutes Sitting with Back Unsupported but Feet Supported on Floor or Stool: Able to sit safely and securely 2 minutes Stand to Sit: Sits safely with minimal use of hands Transfers: Able to transfer safely, minor use of hands Standing Unsupported with Eyes Closed: Able to stand 10 seconds safely Standing Ubsupported with Feet Together: Able to place feet together independently and stand 1 minute safely From Standing, Reach Forward with Outstretched Arm: Can reach confidently >25 cm (10") From Standing Position, Pick up Object from Floor: Able to pick up shoe safely and easily From Standing Position, Turn to Look Behind Over each Shoulder: Looks behind from both sides and weight shifts well Turn 360 Degrees: Able to turn 360 degrees safely in 4 seconds or less Standing Unsupported, Alternately Place Feet on Step/Stool: Able to stand independently and safely and complete 8 steps in 20 seconds Standing Unsupported, One Foot in Front: Able to place foot tandem independently and hold 30  seconds Standing on One Leg: Able to lift leg independently and hold > 10 seconds Total Score: 56 Static Sitting Balance Static Sitting - Balance Support: Feet unsupported;No upper extremity supported Static Sitting - Level of Assistance: 7: Independent Dynamic Sitting Balance Dynamic Sitting - Balance Support: During functional activity;Feet supported;No upper extremity supported Dynamic Sitting - Level of Assistance: 7: Independent Static Standing Balance Static Standing - Balance Support: No upper extremity supported;During functional activity Static Standing - Level of Assistance: 7: Independent Dynamic Standing Balance Dynamic Standing - Balance Support: No upper extremity supported;During functional activity Dynamic Standing - Level of Assistance: 7: Independent Functional Gait  Assessment Gait Level Surface: Walks 20 ft in less than 5.5 sec, no assistive devices, good speed, no evidence for imbalance, normal gait pattern, deviates no more than 6 in outside of the 12 in walkway width. Change in Gait Speed: Able to smoothly change walking speed without loss of balance or gait deviation. Deviate no more than 6 in outside of the 12 in walkway width. Gait with Horizontal Head Turns: Performs head turns smoothly with slight change in gait velocity (eg, minor disruption to smooth gait path), deviates 6-10 in outside 12 in walkway width, or uses an assistive device. Gait with Vertical Head Turns: Performs task with slight change in gait velocity (eg, minor disruption to smooth gait path), deviates 6 - 10 in outside 12 in walkway width or uses assistive device Gait and Pivot Turn: Pivot turns safely within 3 sec and stops quickly with no loss of balance. Step Over Obstacle: Is able to step over 2 stacked shoe boxes taped together (9 in total height) without changing gait speed. No evidence of imbalance. Gait with Narrow Base of Support: Ambulates 4-7 steps. Gait with Eyes Closed: Walks 20 ft,  slow speed, abnormal gait pattern, evidence for imbalance, deviates 10-15 in outside 12 in walkway width. Requires more than 9 sec to ambulate 20 ft. Ambulating Backwards: Walks 20 ft, uses assistive device, slower speed, mild gait deviations, deviates 6-10 in outside 12 in walkway width. Steps: Alternating feet, must use rail. Total Score: 22 Extremity/Trunk Assessment RUE Assessment RUE Assessment: Within Functional Limits LUE Assessment LUE Assessment: Within Functional Limits   Curtis Sites 11/21/2020, 9:43 AM

## 2020-11-21 NOTE — Progress Notes (Signed)
Physical Therapy Discharge Summary  Patient Details  Name: Antonio Higgins MRN: [id number] Date of Birth: [birth year]  Today's Date: 11/21/2020   Patient has met 9 of 9 long term goals due to improved activity tolerance, improved balance, improved postural control, increased strength, decreased pain, ability to compensate for deficits, improved attention and improved awareness.  Patient to discharge at an ambulatory level Supervision.   Patient's care partner is independent to provide the necessary physical and cognitive assistance at discharge.  Reasons goals not met: n/a  Recommendation:  Patient will benefit from ongoing skilled PT services in outpatient setting to continue to advance safe functional mobility, address ongoing impairments in gait training, activity tolerance, work conditioning, dual task training, safety awareness, attention, patient/caregiver education, and minimize fall risk.  Equipment: No equipment provided  Reasons for discharge: treatment goals met  Patient/family agrees with progress made and goals achieved: Yes  PT Discharge Precautions/Restrictions Restrictions Weight Bearing Restrictions: No Vision/Perception  Vision - Assessment Ocular Range of Motion: Within Functional Limits Alignment/Gaze Preference: Within Defined Limits Tracking/Visual Pursuits: Able to track stimulus in all quads without difficulty Convergence: Within functional limits Perception Perception: Within Functional Limits Praxis Praxis: Intact  Cognition Overall Cognitive Status: Impaired/Different from baseline Arousal/Alertness: Awake/alert Orientation Level: Oriented X4 Attention: Selective Focused Attention: Appears intact Sustained Attention: Impaired Sustained Attention Impairment: Functional complex Selective Attention: Appears intact Memory: Impaired Memory Impairment: Decreased short term memory Decreased Short Term Memory: Verbal complex Awareness: Appears  intact Awareness Impairment: Anticipatory impairment Problem Solving: Impaired Problem Solving Impairment: Functional complex Safety/Judgment: Appears intact [place] Scales of Cognitive Functioning: Purposeful/appropriate Sensation Sensation Light Touch: Appears Intact Proprioception: Appears Intact Coordination Gross Motor Movements are Fluid and Coordinated: Yes Fine Motor Movements are Fluid and Coordinated: Yes Heel Shin Test: [facility] Motor  Motor Motor: Within Functional Limits  Mobility Bed Mobility Bed Mobility: Rolling Right;Rolling Left;Sit to Supine;Supine to Sit Rolling Right: Independent Rolling Left: Independent Supine to Sit: Independent Sit to Supine: Independent Transfers Transfers: Sit to Stand;Stand to [employer] Transfers Sit to Stand: Independent Stand to Sit: Independent Stand Pivot Transfers: Independent Transfer (Assistive device): None Locomotion  Gait Ambulation: Yes Gait Assistance: Supervision/Verbal cueing Gait Distance (Feet): 1002 Feet Assistive device: None Gait Assistance Details: Verbal cues for precautions/safety Gait Gait: Yes Gait Pattern: Impaired Gait Pattern: Lateral hip instability;Wide base of support;Decreased stride length Gait velocity: 0.85 m/s Stairs / Additional Locomotion Stairs: Yes Stairs Assistance: Supervision/Verbal cueing Stair Management Technique: One rail Right Number of Stairs: 12 Height of Stairs: 6 Ramp: Supervision/Verbal cueing Curb: Supervision/Verbal cueing Wheelchair Mobility Wheelchair Mobility: No  Trunk/Postural Assessment  Cervical Assessment Cervical Assessment: Within Functional Limits Thoracic Assessment Thoracic Assessment: Within Functional Limits Lumbar Assessment Lumbar Assessment: Within Functional Limits Postural Control Postural Control: Deficits on evaluation  Balance Balance Balance Assessed: Yes Standardized Balance Assessment Standardized Balance Assessment:  Berg Balance Test;Functional Gait Assessment Berg Balance Test Sit to Stand: Able to stand without using hands and stabilize independently Standing Unsupported: Able to stand safely 2 minutes Sitting with Back Unsupported but Feet Supported on Floor or Stool: Able to sit safely and securely 2 minutes Stand to Sit: Sits safely with minimal use of hands Transfers: Able to transfer safely, minor use of hands Standing Unsupported with Eyes Closed: Able to stand 10 seconds safely Standing Ubsupported with Feet Together: Able to place feet together independently and stand 1 minute safely From Standing, Reach Forward with Outstretched Arm: Can reach confidently >25 cm (10") From Standing Position, Pick up Object  from Floor: Able to pick up shoe safely and easily From Standing Position, Turn to Look Behind Over each Shoulder: Looks behind from both sides and weight shifts well Turn 360 Degrees: Able to turn 360 degrees safely in 4 seconds or less Standing Unsupported, Alternately Place Feet on Step/Stool: Able to stand independently and safely and complete 8 steps in 20 seconds Standing Unsupported, One Foot in Front: Able to place foot tandem independently and hold 30 seconds Standing on One Leg: Able to lift leg independently and hold > 10 seconds Total Score: 56 Static Sitting Balance Static Sitting - Balance Support: Feet unsupported;No upper extremity supported Static Sitting - Level of Assistance: 7: Independent Dynamic Sitting Balance Dynamic Sitting - Balance Support: During functional activity;Feet supported;No upper extremity supported Dynamic Sitting - Level of Assistance: 7: Independent Static Standing Balance Static Standing - Balance Support: No upper extremity supported;During functional activity Static Standing - Level of Assistance: 7: Independent Dynamic Standing Balance Dynamic Standing - Balance Support: No upper extremity supported;During functional activity Dynamic Standing  - Level of Assistance: 7: Independent Functional Gait  Assessment Gait Level Surface: Walks 20 ft in less than 5.5 sec, no assistive devices, good speed, no evidence for imbalance, normal gait pattern, deviates no more than 6 in outside of the 12 in walkway width. Change in Gait Speed: Able to smoothly change walking speed without loss of balance or gait deviation. Deviate no more than 6 in outside of the 12 in walkway width. Gait with Horizontal Head Turns: Performs head turns smoothly with slight change in gait velocity (eg, minor disruption to smooth gait path), deviates 6-10 in outside 12 in walkway width, or uses an assistive device. Gait with Vertical Head Turns: Performs task with slight change in gait velocity (eg, minor disruption to smooth gait path), deviates 6 - 10 in outside 12 in walkway width or uses assistive device Gait and Pivot Turn: Pivot turns safely within 3 sec and stops quickly with no loss of balance. Step Over Obstacle: Is able to step over 2 stacked shoe boxes taped together (9 in total height) without changing gait speed. No evidence of imbalance. Gait with Narrow Base of Support: Ambulates 4-7 steps. Gait with Eyes Closed: Walks 20 ft, slow speed, abnormal gait pattern, evidence for imbalance, deviates 10-15 in outside 12 in walkway width. Requires more than 9 sec to ambulate 20 ft. Ambulating Backwards: Walks 20 ft, uses assistive device, slower speed, mild gait deviations, deviates 6-10 in outside 12 in walkway width. Steps: Alternating feet, must use rail. Total Score: 22 Extremity Assessment  RLE Assessment RLE Assessment: Within Functional Limits General Strength Comments: grossly 4+/5 to 5/5 proximal to distal LLE Assessment LLE Assessment: Within Functional Limits General Strength Comments: grossly 5/5 throughout in sitting    Doyt Castellana L Tlaloc Taddei PT, DPT  11/21/2020, 12:21 PM

## 2020-11-21 NOTE — Progress Notes (Signed)
Inpatient Rehabilitation Care Coordinator Discharge Note  The overall goal for the admission was met for:   Discharge location: Yes. D/c to home with 24/7 care from various family members.   Length of Stay: Yes. 3 days.   Discharge activity level: Yes. Supervision.  Home/community participation: Yes. Limited.   Services provided included: MD, RD, PT, OT, SLP, RN, CM, TR, Pharmacy, Neuropsych and SW  Financial Services: Other: Uninsured  Choices offered to/list presented to: Yes  Follow-up services arranged: Outpatient: [facility] PT/OT/SLP and DME: No DME  Comments (or additional information):  Patient/Family verbalized understanding of follow-up arrangements: Yes  Individual responsible for coordination of the follow-up plan: contact pt wife [name] [phone]  Confirmed correct DME delivered: Rana Snare 11/21/2020    Rana Snare

## 2020-11-21 NOTE — Progress Notes (Signed)
Speech Language Pathology Discharge Summary  Patient Details  Name: Antonio Higgins MRN: [id number] Date of Birth: Jan 30, 1986  Today's Date: 11/21/2020 SLP Individual Time: 0830-0900 SLP Individual Time Calculation (min): 30 min   Skilled Therapeutic Interventions:  Skilled treatment session focused on completion of family education with the patient and his wife. 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 Both verbalized understanding and handouts were given to reinforce information. They were both also educated regarding current diet recommendations, compensatory strategies and medication administration. All education is complete and patient will discharge home today after therapy.   Patient has met 5 of 5 long term goals.  Patient to discharge at overall Supervision level.   Reasons goals not met: N/A   Clinical Impression/Discharge Summary: Patient has made excellent gains and has met 5 of 5 LTGs this admission. 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 Patient and family education is complete and patient will discharge home with 24 hour supervision from family. Patient would benefit from f/u SLP services to maximize his cognitive functioning and overall functional independence in order to reduce caregiver burden.   Care Partner:  Caregiver Able to Provide Assistance: Yes  Type of Caregiver Assistance: Physical;Cognitive  Recommendation:  24 hour supervision/assistance;Outpatient SLP  Rationale for SLP Follow Up: Reduce caregiver burden;Maximize cognitive function and  independence   Equipment: N/A   Reasons for discharge: Discharged from hospital;Treatment goals met   Patient/Family Agrees with Progress Made and Goals Achieved: Yes    Lacora Folmer 11/21/2020, 6:52 AM

## 2020-11-22 ENCOUNTER — Encounter: Payer: Self-pay | Admitting: Registered Nurse

## 2020-11-22 ENCOUNTER — Telehealth (HOSPITAL_COMMUNITY): Payer: Self-pay | Admitting: Physical Medicine & Rehabilitation

## 2020-11-22 DIAGNOSIS — S069X9S Unspecified intracranial injury with loss of consciousness of unspecified duration, sequela: Secondary | ICD-10-CM

## 2020-11-22 NOTE — Telephone Encounter (Signed)
Referrals made to [facility] for therapy. PT, OT, SLP

## 2020-11-23 ENCOUNTER — Telehealth: Payer: Self-pay
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
## 2020-11-27 ENCOUNTER — Ambulatory Visit (HOSPITAL_COMMUNITY): Payer: Self-pay | Attending: Physical Medicine & Rehabilitation | Admitting: Physical Therapy

## 2020-11-27 ENCOUNTER — Encounter (HOSPITAL_COMMUNITY): Payer: Self-pay | Admitting: Physical Therapy

## 2020-11-27 ENCOUNTER — Other Ambulatory Visit: Payer: Self-pay

## 2020-11-27 DIAGNOSIS — R41841 Cognitive communication deficit: Secondary | ICD-10-CM | POA: Insufficient documentation

## 2020-11-27 DIAGNOSIS — M6281 Muscle weakness (generalized): Secondary | ICD-10-CM | POA: Insufficient documentation

## 2020-11-27 DIAGNOSIS — S069X9S Unspecified intracranial injury with loss of consciousness of unspecified duration, sequela: Secondary | ICD-10-CM | POA: Insufficient documentation
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
## 2020-11-27 NOTE — Patient Instructions (Signed)
Access Code: [email or web address] URL: [email or web address] Date: 11/27/2020 Prepared by: Georges Lynch  Exercises Sit to Stand Without Arm Support - 1 x daily - 4-5 x weekly - 2 sets - 10 reps Heel rises with counter support - 1 x daily - 4-5 x weekly - 2 sets - 10 reps Hip Abduction with Resistance Loop - 1 x daily - 4-5 x weekly - 2 sets - 10 reps Hip Extension with Resistance Loop - 1 x daily - 4-5 x weekly - 2 sets - 10 reps Single Leg Stance with Support - 1 x daily - 4-5 x weekly - 1 sets - 3 reps - 20-30 seconds hold

## 2020-11-29 ENCOUNTER — Other Ambulatory Visit: Payer: Self-pay

## 2020-11-29 ENCOUNTER — Ambulatory Visit (HOSPITAL_COMMUNITY): Payer: Self-pay | Admitting: Occupational Therapy

## 2020-11-29 ENCOUNTER — Encounter (HOSPITAL_COMMUNITY): Payer: Self-pay | Admitting: Speech Pathology

## 2020-11-29 ENCOUNTER — Ambulatory Visit (HOSPITAL_COMMUNITY): Payer: Self-pay | Admitting: Speech Pathology

## 2020-11-29 DIAGNOSIS — R41841 Cognitive communication deficit: Secondary | ICD-10-CM

## 2020-11-29 DIAGNOSIS — S069X9S Unspecified intracranial injury with loss of consciousness of unspecified duration, sequela: Secondary | ICD-10-CM
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
## 2020-11-29 NOTE — Therapy (Signed)
[facility] [address] [place], [place], [address] Phone: [phone]   Fax:  [phone]  Patient Details  Name: Antonio Higgins MRN: [id number] Date of Birth: February 01, 1986 Referring Provider:  Ranelle Oyster, MD  Encounter Date: 11/29/2020   OT Screen    11/29/20 0933  OT Visits / Re-Eval  Visit Number 1  Number of Visits 1  Date for OT Re-Evaluation 11/30/20  Authorization  Authorization Type Self-pay  OT Time Calculation  OT Start Time 0905  OT Stop Time 0920  OT Time Calculation (min) 15 min  End of Session  Activity Tolerance Patient tolerated treatment well  Behavior During Therapy [facility] for tasks assessed/performed    Pt arrived for OT evaluation, per chart review and discussion with OP PT pt demonstrates modified independence in mobility and ADLs therefore screen completed with pt and wife. Pt is completing ADLs independently at home, took shower without supervision for first time yesterday and alerted wife via phone before and after, using DME as needed. Pt reports BLE fatigue at times. Pt with BUE strength WNL, no difficulties with coordination.   Pt reports sensory overload at times, discussed triggers and limitations with pt who verbalizes good self-awareness of impending overload and removes himself from the input when possible. Also discussed energy conservation and building activity tolerance slowly, as well as improving muscle memory via resuming daily tasks and normal daily routine as able.   Educated on discussing driving with MD, TMT that SLP may complete as cognitive testing. Educated on beginning driving in a quiet area and progressing to busier areas once cleared to drive by MD.   No further outpatient OT services required at this time as pt is independent in ADLs. Cognitive deficits will be addressed by OP SLP. Pt is aware of strategies and techniques for building activity tolerance, adapting to aversive sensory  stimuli, and working back up to normal routine in small increments.    Ezra Sites, OTR/L  [phone] 11/29/2020, 9:24 AM  [facility] [address] [place], [place], [address] Phone: [phone]   Fax:  [phone]

## 2020-12-05 ENCOUNTER — Encounter: Payer: Self-pay | Admitting: Registered Nurse

## 2020-12-05 ENCOUNTER — Encounter: Payer: Self-pay | Attending: Registered Nurse | Admitting: Registered Nurse

## 2020-12-05 ENCOUNTER — Other Ambulatory Visit: Payer: Self-pay

## 2020-12-05 VITALS — BP 135/96 | HR 101 | Temp 98.3°F | Ht 74.0 in | Wt 276.0 lb

## 2020-12-05 DIAGNOSIS — R55 Syncope and collapse: Secondary | ICD-10-CM | POA: Insufficient documentation

## 2020-12-05 DIAGNOSIS — R233 Spontaneous ecchymoses: Secondary | ICD-10-CM | POA: Insufficient documentation

## 2020-12-05 DIAGNOSIS — I609 Nontraumatic subarachnoid hemorrhage, unspecified: Secondary | ICD-10-CM

## 2020-12-05 DIAGNOSIS — S0219XA Other fracture of base of skull, initial encounter for closed fracture: Secondary | ICD-10-CM | POA: Insufficient documentation

## 2020-12-05 DIAGNOSIS — S06303A Unspecified focal traumatic brain injury with loss of consciousness of 1 hour to 5 hours 59 minutes, initial encounter: Secondary | ICD-10-CM
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
## 2020-12-11 ENCOUNTER — Other Ambulatory Visit: Payer: Self-pay

## 2020-12-11 ENCOUNTER — Ambulatory Visit (HOSPITAL_COMMUNITY): Payer: Self-pay | Admitting: Speech Pathology

## 2020-12-11 ENCOUNTER — Encounter (HOSPITAL_COMMUNITY): Payer: Self-pay | Admitting: Speech Pathology

## 2020-12-11 DIAGNOSIS — R41841 Cognitive communication deficit: Secondary | ICD-10-CM
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
## 2020-12-13 ENCOUNTER — Telehealth: Payer: Self-pay | Admitting: Registered Nurse

## 2020-12-13 NOTE — Telephone Encounter (Signed)
SLP notes were reviewed, at this time Patient will not be given permission to drive. He has a scheduled appointment with Dr Riley Kill, next month. Placed a call to Mr. Hale, no answer. Left message to return the call.

## 2020-12-14 NOTE — Telephone Encounter (Signed)
Antonio Higgins returned your call. Best number to reach them is  [phone].

## 2020-12-14 NOTE — Telephone Encounter (Signed)
Return [employer] number, no answer . Left message Antonio Higgins is not able to drive at this time, he will F/Uwith Dr Riley Kill. SLP notes was reviewed.

## 2020-12-20 ENCOUNTER — Ambulatory Visit (HOSPITAL_COMMUNITY): Payer: Self-pay | Admitting: Speech Pathology

## 2020-12-21 ENCOUNTER — Encounter (HOSPITAL_COMMUNITY): Payer: Self-pay | Admitting: Speech Pathology

## 2020-12-21 ENCOUNTER — Ambulatory Visit (HOSPITAL_COMMUNITY): Payer: Self-pay | Attending: Physical Medicine & Rehabilitation | Admitting: Speech Pathology

## 2020-12-21 ENCOUNTER — Other Ambulatory Visit: Payer: Self-pay

## 2020-12-21 DIAGNOSIS — R41841 Cognitive communication deficit: Secondary | ICD-10-CM | POA: Insufficient documentation
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
## 2020-12-25 ENCOUNTER — Ambulatory Visit (HOSPITAL_COMMUNITY): Payer: Self-pay | Admitting: Speech Pathology

## 2020-12-25 ENCOUNTER — Encounter (HOSPITAL_COMMUNITY): Payer: Self-pay | Admitting: Speech Pathology

## 2020-12-25 ENCOUNTER — Other Ambulatory Visit: Payer: Self-pay

## 2020-12-25 DIAGNOSIS — R41841 Cognitive communication deficit: Secondary | ICD-10-CM
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
## 2021-01-03 ENCOUNTER — Encounter: Payer: Self-pay | Attending: Registered Nurse | Admitting: Physical Medicine & Rehabilitation

## 2021-01-03 ENCOUNTER — Encounter: Payer: Self-pay | Admitting: Physical Medicine & Rehabilitation

## 2021-01-03 ENCOUNTER — Other Ambulatory Visit: Payer: Self-pay

## 2021-01-03 ENCOUNTER — Ambulatory Visit (HOSPITAL_COMMUNITY): Payer: Self-pay | Admitting: Speech Pathology

## 2021-01-03 VITALS — BP 124/85 | HR 67 | Temp 98.2°F | Ht 74.0 in | Wt 279.2 lb

## 2021-01-03 DIAGNOSIS — S06303A Unspecified focal traumatic brain injury with loss of consciousness of 1 hour to 5 hours 59 minutes, initial encounter: Secondary | ICD-10-CM | POA: Insufficient documentation

## 2021-01-03 NOTE — Patient Instructions (Addendum)
YOU MAY RESUME WORK 30 HOURS PER WEEK. AVOID EXCESSIVE HEAT AND SUN AS POSSIBLE. STAY HYDRATED
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
## 2021-05-17 ENCOUNTER — Encounter (HOSPITAL_COMMUNITY): Payer: Self-pay | Admitting: Speech Pathology

## 2021-05-17 NOTE — Therapy (Signed)
Antonio Higgins, [place], [address] Phone: [phone]   Fax:  [phone]  Patient Details  Name: Antonio Higgins MRN: [id number] Date of Birth: 07/17/1985 Referring Provider:  No ref. provider found  Encounter Date: 05/17/2021  SPEECH THERAPY DISCHARGE SUMMARY  Visits from Start of Care: 4  Current functional level related to goals / functional outcomes: Goals met   Remaining deficits: N/A   Education / Equipment: complete   Patient agrees to discharge. Patient goals were met. Patient is being discharged due to meeting the stated rehab goals.[name],  Genene Churn, [place]  [facility] 05/17/2021, 3:36 PM  [place] [address] [place], [place], [address] Phone: [phone]   Fax:  [phone]

## 2021-07-03 IMAGING — CT CT HEAD W/O CM
4 series · 15 of 47 positions shown, 17 images · non-contrast
Comparison: CT head 11/03/2020

CLINICAL DATA: Head trauma.

EXAM:
CT HEAD WITHOUT CONTRAST
TECHNIQUE: Contiguous axial images were obtained from the base of the skull
through the vertex without intravenous contrast.

[Series 3: head without · axial · non-contrast · 0.45mm/px · z∈[-444,-320]mm · 7 of 35 slices shown, 9 images]
[im 5/35  brain]
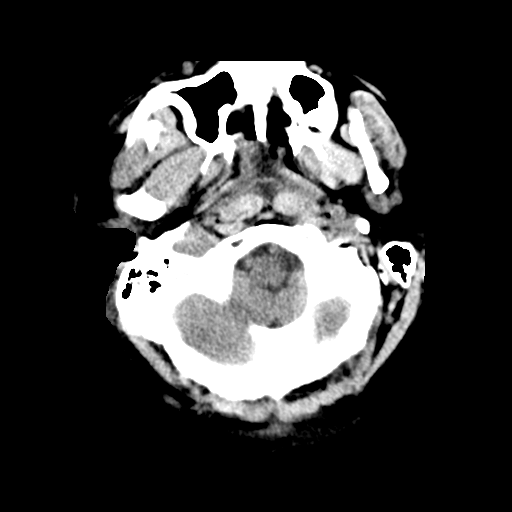
[im 5/35  bone]
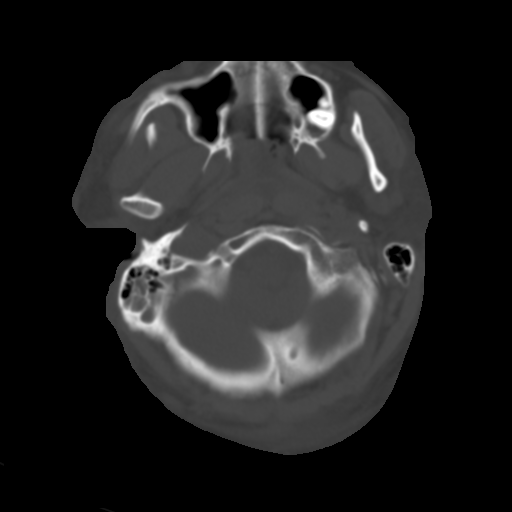
[im 9/35  brain]
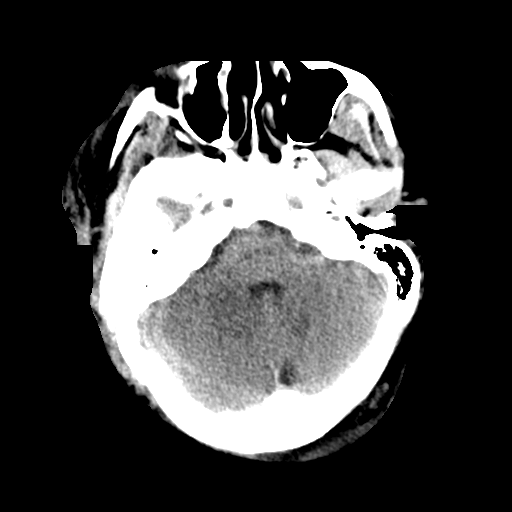
[im 13/35  brain]
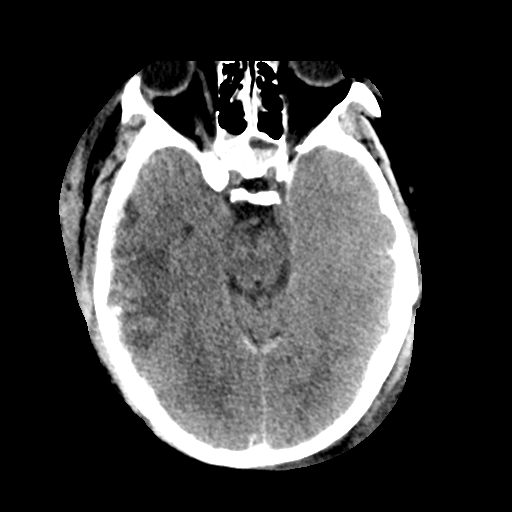
[im 18/35  brain]
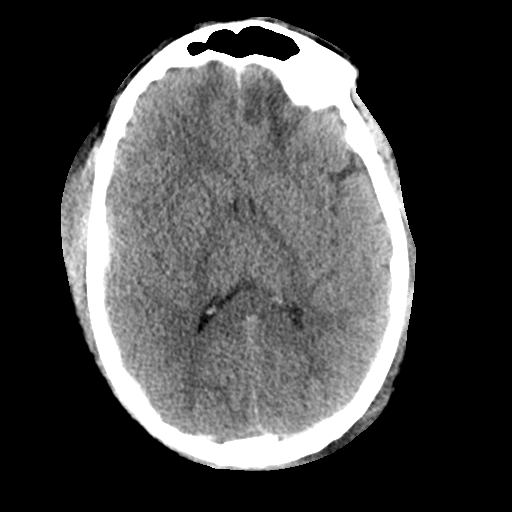
[im 22/35  brain]
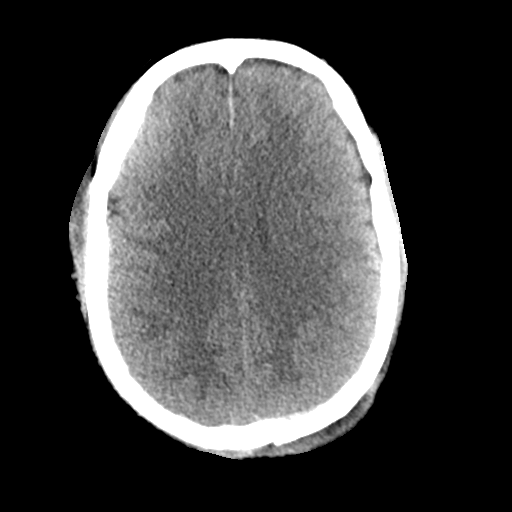
[im 22/35  bone]
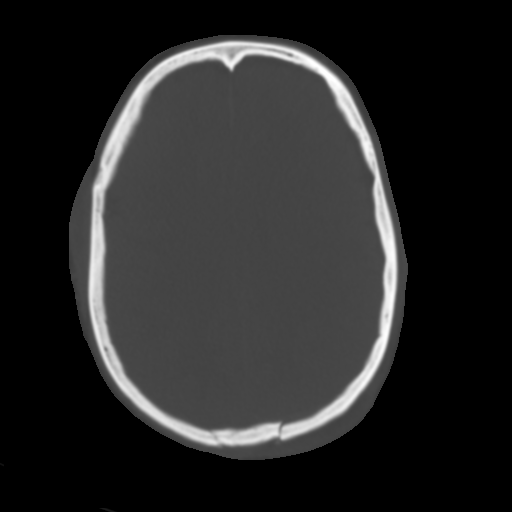
[im 26/35  brain]
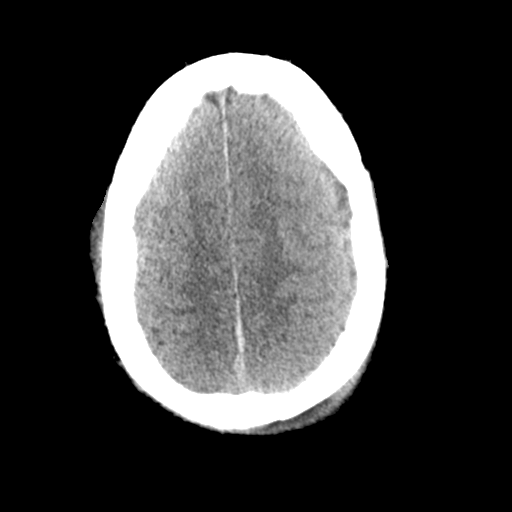
[im 30/35  brain]
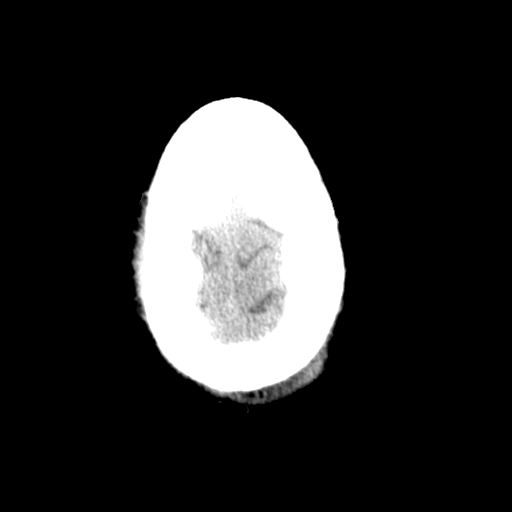

[Series 4: head bone · axial · 0.45mm/px · z∈[-448,-430]mm · 2 of 87 slices shown]
[im 9/87  bone]
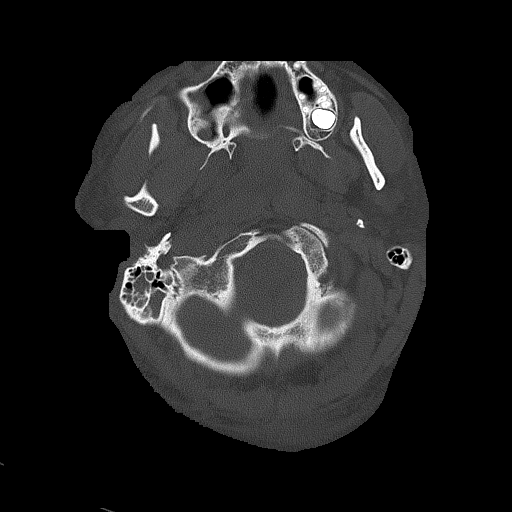
[im 18/87  bone]
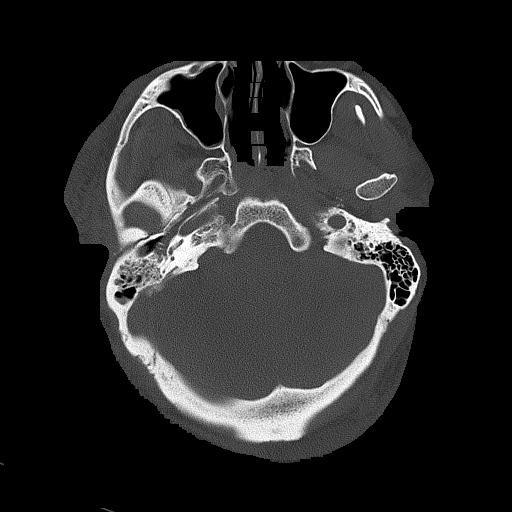

[Series 5: head without cor · coronal · non-contrast · 0.36mm/px · 3 of 81 slices shown]
[im 30/81  brain]
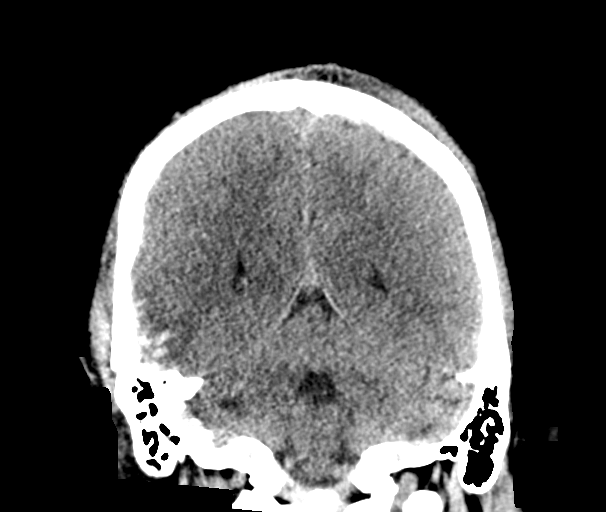
[im 37/81  brain]
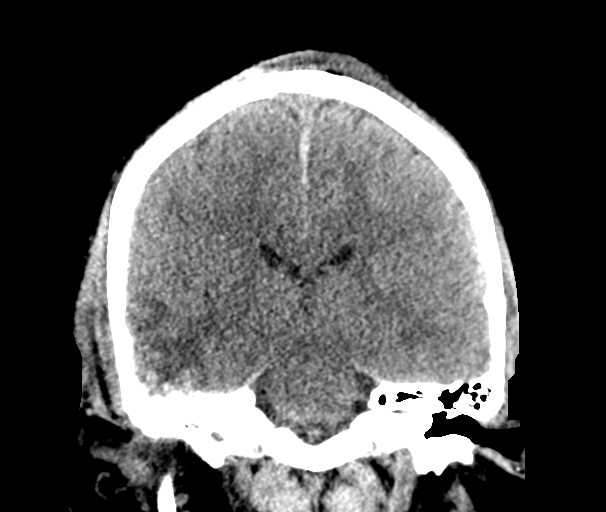
[im 44/81  brain]
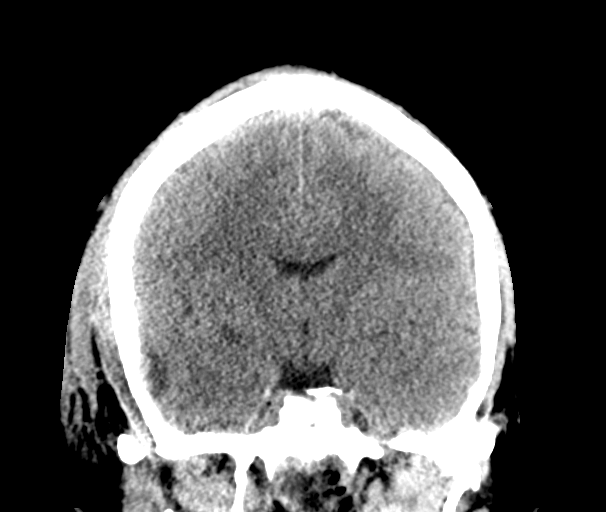

[Series 6: head without sag · sagittal · non-contrast · 0.34mm/px · 3 of 72 slices shown]
[im 27/72  brain]
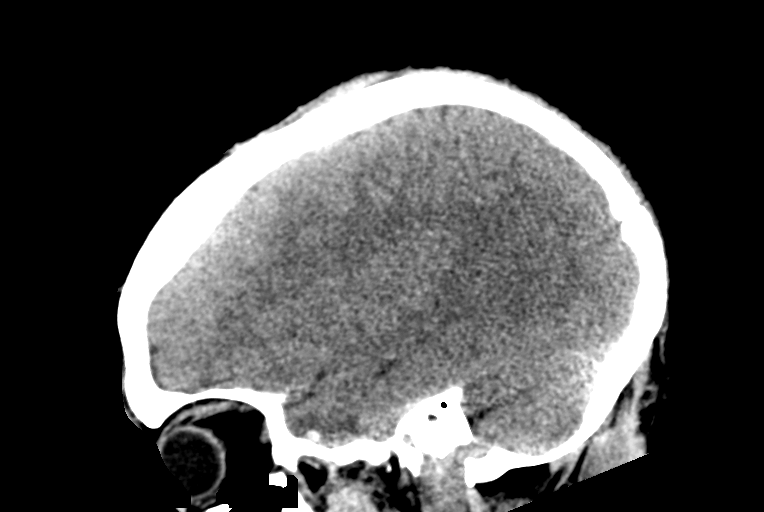
[im 36/72  brain]
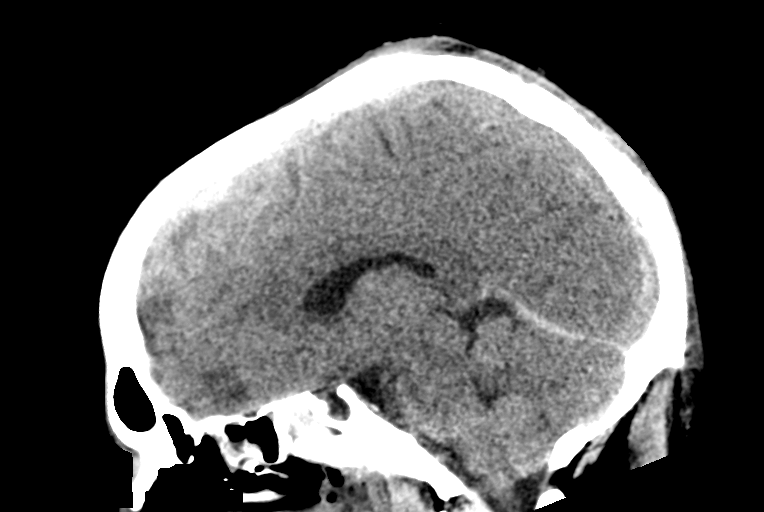
[im 46/72  brain]
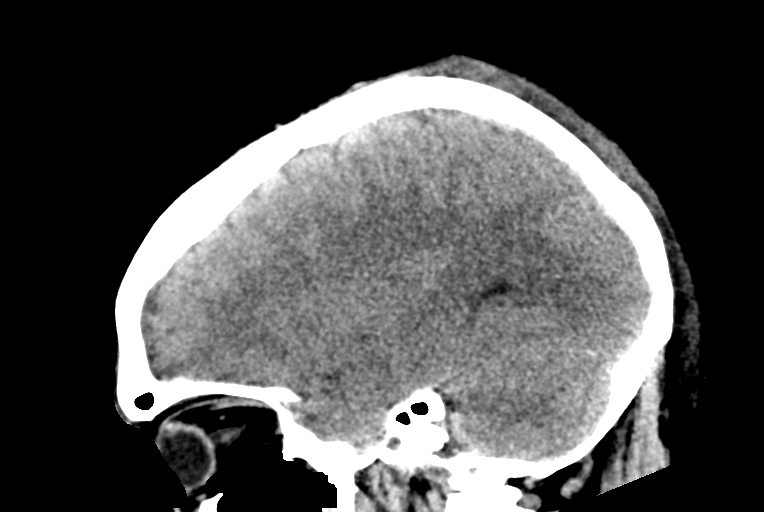

[15 of 47 positions shown; findings below may reference images not displayed]

FINDINGS: Brain: Hemorrhagic contusion in the anterior frontal lobes left
greater than right unchanged. Large hemorrhagic contusion right
lateral temporal lobe unchanged. Small right temporal subdural
hematoma unchanged. Mild interhemispheric subdural hematoma
unchanged.

Ventricle size normal.  No midline shift.  No new hemorrhage.

Vascular: Negative for hyperdense vessel

Skull: Fracture through the right lateral mastoid sinus extending
into the parietal bone. Displaced fracture fragment in the posterior
parietal bone in the midline.

Sinuses/Orbits: Mucosal edema in the paranasal sinuses. Air-fluid
level sphenoid sinus. Negative orbit

Other: Right facial and temporal soft tissue contusion.
IMPRESSION: Contusions in the anterior frontal lobes unchanged. Hemorrhagic
contusion right lateral temporal lobe unchanged.

Mild right temporal subdural hematoma unchanged. Mild
interhemispheric subdural hematoma unchanged. No new finding since
the prior CT.

## 2021-07-11 IMAGING — DX DG CHEST 1V PORT
1 series · 1 of 1 positions shown · non-contrast
Comparison: November 08, 2020

CLINICAL DATA: Intubated

EXAM:
PORTABLE CHEST 1 VIEW

[chest ap]
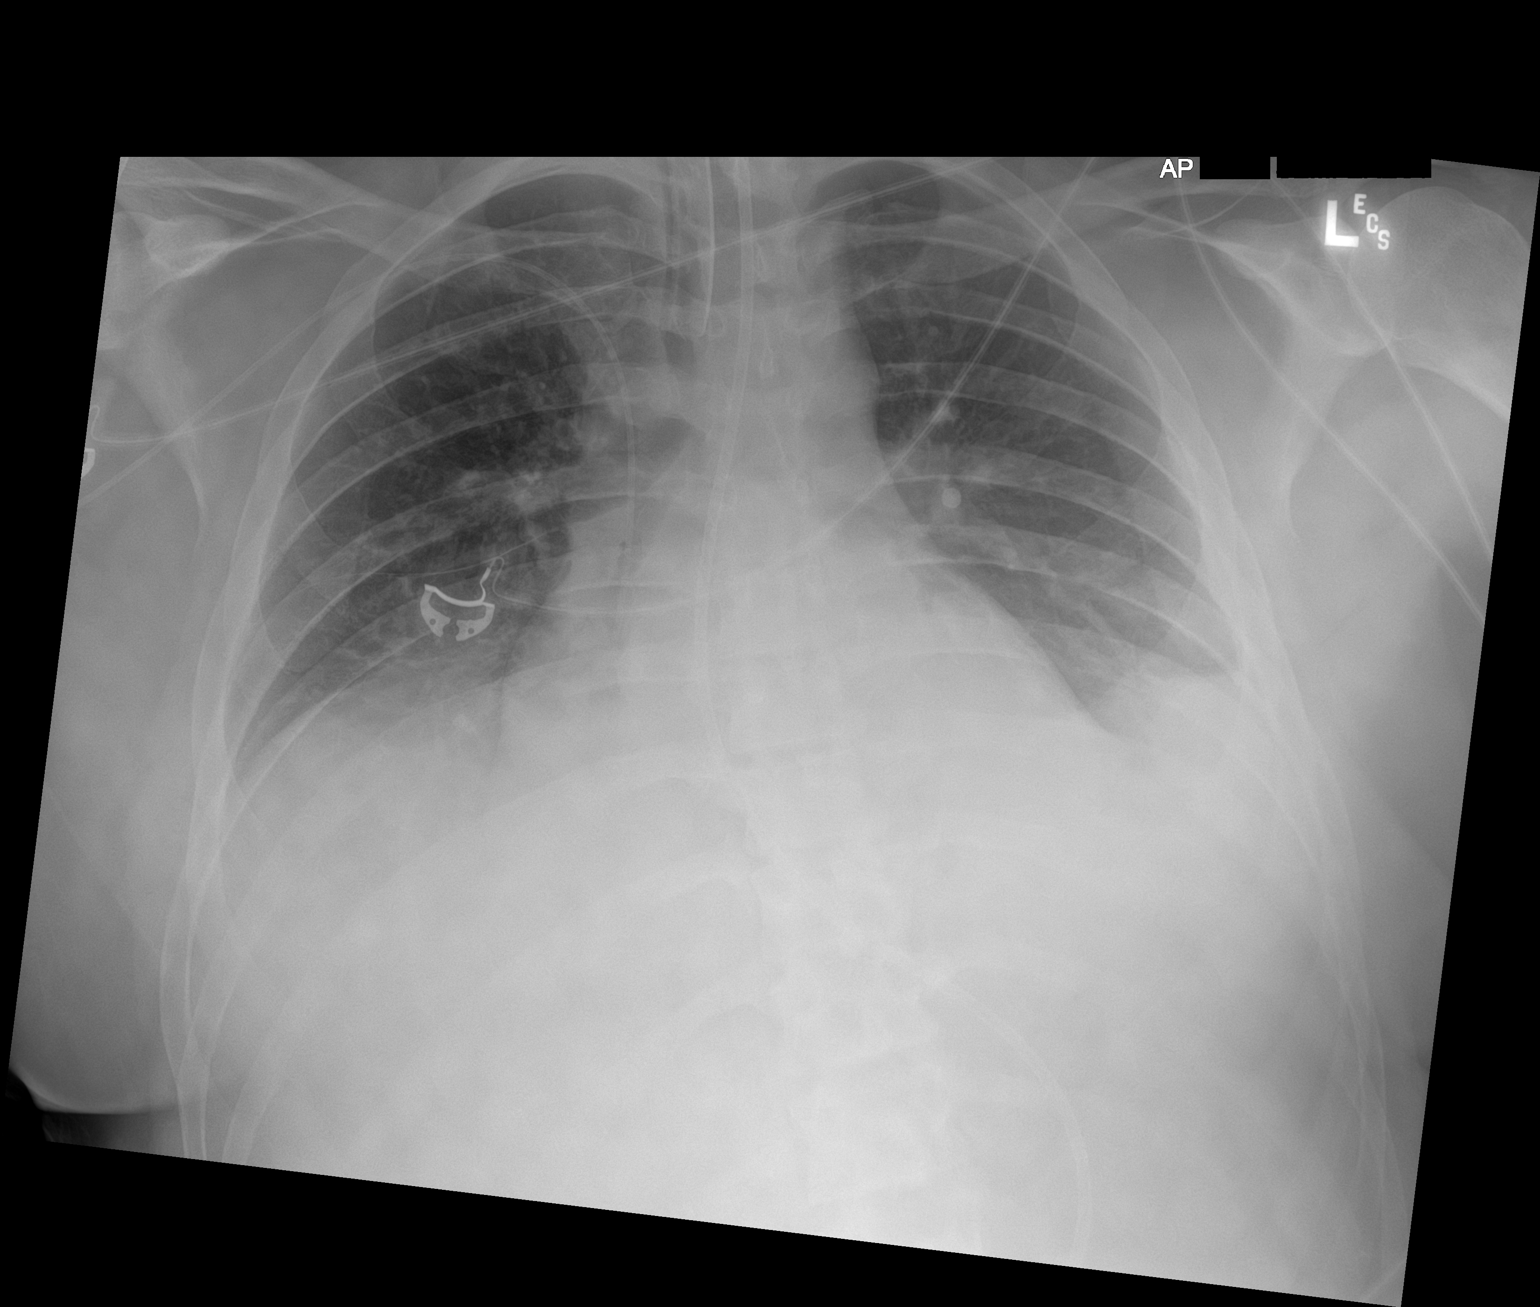

[1 of 1 positions shown; findings below may reference images not displayed]

FINDINGS: The cardiomediastinal silhouette is unchanged in contour.ETT tip
terminates approximately 2.5 cm above the carina. The enteric tube
courses through the chest to the abdomen beyond the field-of-view.
RIGHT upper extremity PICC tip terminates over the superior
cavoatrial junction. Small LEFT pleural effusion. No pneumothorax.
Bibasilar hazy opacities, likely atelectasis. Visualized abdomen is
unremarkable. No acute osseous abnormality.
IMPRESSION: 1.  Support apparatus as described above.
2. Likely small LEFT pleural effusions with bibasilar atelectasis.
